# Patient Record
Sex: Female | Born: 1965 | Race: White | Hispanic: No | Smoking: Never smoker
Health system: Southern US, Community
[De-identification: ages and names within clinical notes are randomized; demographics above are authoritative.]

## PROBLEM LIST (undated history)

## (undated) DIAGNOSIS — Z8719 Personal history of other diseases of the digestive system: Secondary | ICD-10-CM

## (undated) DIAGNOSIS — E7800 Pure hypercholesterolemia, unspecified: Secondary | ICD-10-CM

## (undated) DIAGNOSIS — M1990 Unspecified osteoarthritis, unspecified site: Secondary | ICD-10-CM

## (undated) DIAGNOSIS — T4145XA Adverse effect of unspecified anesthetic, initial encounter: Secondary | ICD-10-CM

## (undated) DIAGNOSIS — Z8489 Family history of other specified conditions: Secondary | ICD-10-CM

## (undated) DIAGNOSIS — K8020 Calculus of gallbladder without cholecystitis without obstruction: Secondary | ICD-10-CM

## (undated) DIAGNOSIS — R51 Headache: Secondary | ICD-10-CM

## (undated) DIAGNOSIS — T8859XA Other complications of anesthesia, initial encounter: Secondary | ICD-10-CM

## (undated) HISTORY — DX: Pure hypercholesterolemia, unspecified: E78.00

## (undated) HISTORY — PX: SHOULDER SURGERY: SHX246

## (undated) HISTORY — PX: KNEE SURGERY: SHX244

## (undated) HISTORY — PX: SHOULDER ARTHROSCOPY WITH ROTATOR CUFF REPAIR AND OPEN BICEPS TENODESIS: SHX6677

## (undated) HISTORY — DX: Calculus of gallbladder without cholecystitis without obstruction: K80.20

## (undated) HISTORY — PX: ADENOIDECTOMY: SUR15

## (undated) HISTORY — PX: KNEE ARTHROSCOPY: SUR90

---

## 1999-06-18 ENCOUNTER — Other Ambulatory Visit: Admission: RE | Admit: 1999-06-18 | Discharge: 1999-06-18 | Payer: Self-pay | Admitting: Obstetrics and Gynecology

## 2000-07-21 ENCOUNTER — Other Ambulatory Visit: Admission: RE | Admit: 2000-07-21 | Discharge: 2000-07-21 | Payer: Self-pay | Admitting: Gynecology

## 2001-02-14 ENCOUNTER — Inpatient Hospital Stay (HOSPITAL_COMMUNITY): Admission: AD | Admit: 2001-02-14 | Discharge: 2001-02-18 | Payer: Self-pay | Admitting: *Deleted

## 2001-02-14 ENCOUNTER — Encounter (INDEPENDENT_AMBULATORY_CARE_PROVIDER_SITE_OTHER): Payer: Self-pay | Admitting: Specialist

## 2001-03-24 ENCOUNTER — Other Ambulatory Visit: Admission: RE | Admit: 2001-03-24 | Discharge: 2001-03-24 | Payer: Self-pay | Admitting: Gynecology

## 2002-03-31 ENCOUNTER — Other Ambulatory Visit: Admission: RE | Admit: 2002-03-31 | Discharge: 2002-03-31 | Payer: Self-pay | Admitting: Gynecology

## 2003-10-02 ENCOUNTER — Other Ambulatory Visit: Admission: RE | Admit: 2003-10-02 | Discharge: 2003-10-02 | Payer: Self-pay | Admitting: Gynecology

## 2003-10-09 ENCOUNTER — Ambulatory Visit (HOSPITAL_COMMUNITY): Admission: RE | Admit: 2003-10-09 | Discharge: 2003-10-09 | Payer: Self-pay | Admitting: Gynecology

## 2004-10-06 ENCOUNTER — Other Ambulatory Visit: Admission: RE | Admit: 2004-10-06 | Discharge: 2004-10-06 | Payer: Self-pay | Admitting: Gynecology

## 2005-03-12 ENCOUNTER — Other Ambulatory Visit: Admission: RE | Admit: 2005-03-12 | Discharge: 2005-03-12 | Payer: Self-pay | Admitting: Gynecology

## 2005-05-01 ENCOUNTER — Emergency Department (HOSPITAL_COMMUNITY): Admission: EM | Admit: 2005-05-01 | Discharge: 2005-05-01 | Payer: Self-pay | Admitting: Family Medicine

## 2005-07-01 ENCOUNTER — Inpatient Hospital Stay (HOSPITAL_COMMUNITY): Admission: AD | Admit: 2005-07-01 | Discharge: 2005-07-02 | Payer: Self-pay | Admitting: Gynecology

## 2005-08-17 ENCOUNTER — Inpatient Hospital Stay (HOSPITAL_COMMUNITY): Admission: AD | Admit: 2005-08-17 | Discharge: 2005-08-18 | Payer: Self-pay | Admitting: Gynecology

## 2005-09-09 ENCOUNTER — Encounter (INDEPENDENT_AMBULATORY_CARE_PROVIDER_SITE_OTHER): Payer: Self-pay | Admitting: Specialist

## 2005-09-09 ENCOUNTER — Inpatient Hospital Stay (HOSPITAL_COMMUNITY): Admission: RE | Admit: 2005-09-09 | Discharge: 2005-09-12 | Payer: Self-pay | Admitting: Gynecology

## 2005-10-21 ENCOUNTER — Other Ambulatory Visit: Admission: RE | Admit: 2005-10-21 | Discharge: 2005-10-21 | Payer: Self-pay | Admitting: Gynecology

## 2006-01-06 ENCOUNTER — Emergency Department (HOSPITAL_COMMUNITY): Admission: EM | Admit: 2006-01-06 | Discharge: 2006-01-06 | Payer: Self-pay | Admitting: Emergency Medicine

## 2006-10-27 ENCOUNTER — Other Ambulatory Visit: Admission: RE | Admit: 2006-10-27 | Discharge: 2006-10-27 | Payer: Self-pay | Admitting: Gynecology

## 2006-11-11 ENCOUNTER — Ambulatory Visit (HOSPITAL_COMMUNITY): Admission: RE | Admit: 2006-11-11 | Discharge: 2006-11-11 | Payer: Self-pay | Admitting: Gynecology

## 2007-11-02 ENCOUNTER — Encounter: Payer: Self-pay | Admitting: Gynecology

## 2007-11-02 ENCOUNTER — Other Ambulatory Visit: Admission: RE | Admit: 2007-11-02 | Discharge: 2007-11-02 | Payer: Self-pay | Admitting: Gynecology

## 2007-11-02 ENCOUNTER — Ambulatory Visit: Payer: Self-pay | Admitting: Gynecology

## 2008-01-12 ENCOUNTER — Ambulatory Visit (HOSPITAL_COMMUNITY): Admission: RE | Admit: 2008-01-12 | Discharge: 2008-01-12 | Payer: Self-pay | Admitting: Gynecology

## 2008-05-09 ENCOUNTER — Ambulatory Visit: Payer: Self-pay | Admitting: Women's Health

## 2008-06-04 ENCOUNTER — Ambulatory Visit: Payer: Self-pay | Admitting: Gynecology

## 2008-11-02 ENCOUNTER — Ambulatory Visit: Payer: Self-pay | Admitting: Gynecology

## 2008-11-02 ENCOUNTER — Other Ambulatory Visit: Admission: RE | Admit: 2008-11-02 | Discharge: 2008-11-02 | Payer: Self-pay | Admitting: Gynecology

## 2008-11-02 ENCOUNTER — Encounter: Payer: Self-pay | Admitting: Gynecology

## 2009-02-13 ENCOUNTER — Ambulatory Visit (HOSPITAL_COMMUNITY): Admission: RE | Admit: 2009-02-13 | Discharge: 2009-02-13 | Payer: Self-pay | Admitting: Gynecology

## 2009-09-12 ENCOUNTER — Ambulatory Visit (HOSPITAL_BASED_OUTPATIENT_CLINIC_OR_DEPARTMENT_OTHER): Admission: RE | Admit: 2009-09-12 | Discharge: 2009-09-12 | Payer: Self-pay | Admitting: Orthopedic Surgery

## 2009-11-04 ENCOUNTER — Other Ambulatory Visit: Admission: RE | Admit: 2009-11-04 | Discharge: 2009-11-04 | Payer: Self-pay | Admitting: Gynecology

## 2009-11-04 ENCOUNTER — Ambulatory Visit: Payer: Self-pay | Admitting: Gynecology

## 2009-11-26 ENCOUNTER — Ambulatory Visit: Payer: Self-pay | Admitting: Gynecology

## 2010-05-10 LAB — POCT HEMOGLOBIN-HEMACUE: Hemoglobin: 13.7 g/dL (ref 12.0–15.0)

## 2010-07-11 NOTE — H&P (Signed)
[facility]  Patient:    Jasmine Spencer, Jasmine Spencer Visit Number: [id number] MRN: [id number]          Service Type: OBS Location: 910B 9166 01 Attending Physician:  Wetzel Bjornstad Dictated by:   Katy Fitch, M.D. Admit Date:  02/14/2001                           History and Physical  CHIEF COMPLAINT:              1.  Post term pregnancy.                               2.  Decreased fetal movement.  HISTORY OF PRESENT ILLNESS:   The patient is a 45 year old, G1, at 41 weeks and 5 days, who presents to the hospital for decreased fetal movement.  The patient noticed decreased fetal movement over the past day.  The heart tracing is noted to be reactive, however, secondary to being post dates, the patient was admitted for induction.  The patient has had no complaints of fever, nausea, vomiting, chills, abdominal pain, or change in bowel or bladder habits.  The patient had her pregnancy conceived by Clomid, IUI, and Pergonal cycles, but has had no other medical problems during her pregnancy.  PAST MEDICAL HISTORY:         None.  PAST SURGICAL HISTORY:        Knee surgery.  PAST OBSTETRICAL HISTORY:     None.  MEDICATIONS:                  Prenatal vitamins.  ALLERGIES:                    None.  SOCIAL HISTORY:               Without any tobacco, alcohol, or drugs.  FAMILY HISTORY:               Without any mental retardation or epithelial cancers.  PRENATAL LABORATORY DATA:     O positive, rubella immune, GBS negative.  PHYSICAL EXAMINATION:  VITAL SIGNS:                  Blood pressure 122/70.  HEENT:                        Throat clear.  LUNGS:                        Clear to auscultation bilaterally.  HEART:                        Regular rate and rhythm.  ABDOMEN:                      Gravid and nontender.  Estimated fetal weight is 7 pounds and 7 ounces.  PELVIC EXAMINATION:           Cervix fingertip, thick, and high.   Tocolysis. Occasional fetal heart tracings in 140s and reactive.  PLAN:                         We will have patient on high-dose Pitocin.  We will try artificial rupture of membrane if the head descends into the  pelvis. at this point, we will continue on Pitocin and try rupture at a later point. Dictated by:   Katy Fitch, M.D. Attending Physician:  Wetzel Bjornstad DD:  02/15/01 TD:  02/15/01 Job: [id number]

## 2010-07-11 NOTE — Discharge Summary (Signed)
[facility]  Patient:    Jasmine Spencer, Jasmine Spencer Visit Number: [id number] MRN: [id number]          Service Type: OBS Location: 910A 9133 01 Attending Physician:  Jasmine Spencer Dictated by:   Jasmine Spencer, [facility] Admit Date:  02/14/2001 Discharge Date: 02/18/2001                             Discharge Summary  DISCHARGE DIAGNOSES: 1. Intrauterine pregnancy at 41-1/2 weeks. 2. Cephalopelvic disproportion. 3. Total late decelerations, resolved.  PROCEDURES:  Primary low cervical transverse cesarean section with delivery of a viable infant.  HISTORY OF PRESENT ILLNESS:  The patient is a 45 year old primigravida with last menstrual period of 05/03/00, estimated date of confinement 02/07/01. Prenatal risk factors included advanced maternal age.  LABORATORY DATA:  Blood type O+, antibody screen negative, RPR negative, HBSA negative.  Rubella immune.  Amniocentesis normal.  HOSPITAL COURSE AND TREATMENT:  The patient was admitted on 02/15/01, for induction of labor secondary to decreased fetal movement at 41-1/2 weeks. Induction was initiated with high dose Pitocin.  Cervix was fingertip, thick, and high.  The patient did fail to progress, and also developed some late subtle late decelerations which did resolve, but it was decided to proceed with cesarean section delivery.  Procedure was performed by Dr. Farrel Gobble.  The patient delivered an Apgar 8 and 10 female infant, weight 4175 g, clear amniotic fluid, double nuchal cord, normal uterus, tubes, and ovaries. Postoperatively, the patient remained afebrile.  Had no difficulty voiding. Could be discharged in satisfactory condition on her third postoperative day. CBC showed a hematocrit of 31.5, hemoglobin 11, white blood cell count 13.3, platelets 188.  DISPOSITION:  Follow up in six weeks.  Continue prenatal vitamins and iron, and Motrin and Tylox for pain. Dictated by:   Jasmine Spencer, Jasmine Spencer Attending  Physician:  Jasmine Spencer DD:  03/09/01 TD:  03/10/01 Job: [id number]

## 2010-07-11 NOTE — H&P (Signed)
Jasmine Spencer, Jasmine Spencer              ACCOUNT NO.:  [id number]   MEDICAL RECORD NO.:  [id number]          PATIENT TYPE:  INP   LOCATION:  NA                            FACILITY:  [facility]   PHYSICIAN:  Ivor Costa. Farrel Gobble, M.D. DATE OF BIRTH:  May 24, 1965   DATE OF ADMISSION:  10/10/2005  DATE OF DISCHARGE:                                HISTORY & PHYSICAL   PRINCIPAL DIAGNOSIS:  76 1/[redacted] weeks pregnant with a  prior cesarean section,  for elective repeat.   HISTORY OF PRESENT ILLNESS:  The patient is a 45 year old G2, P39 with an LMP  of December 16, 2004, estimated date of confinement of September 22, 2005, whose  estimated gestational age of 9 1/2 weeks with a prior history of cesarean  section, who now presents electively for repeat cesarean section. The  patient's pregnancy was complicated by advanced maternal age. This is a  pregnancy conceived with gonadotropin stimulation and donor sperm. The  patient was noted to have first trimester cystic hygroma that resolved. She  had an amniocentesis because of it, which showed 4 XX, there s normal heart  architecture seen in the late second trimester. The patient pregnancy has  otherwise been uncomplicated. The patient desires for repeat cesarean  section. She is O positive, antibody negative, RPR nonreactive, rubella  immune, hepatitis B surface antigen nonreactive, HIV nonreactive. GBS  positive, Glucola normal. Refer to the [facility]   PHYSICAL EXAMINATION:  GENERAL:  She is a well appearing gravida in no acute  distress.  HEART:  Regular rate.  LUNGS:  Clear to auscultation.  ABDOMEN:  Gravid, nontender. Fetal heart tones were auscultated.  VAGINAL EXAM:  Was deferred last visit, however, most recently was all  closed, posterior and high.  EXTREMITIES:  Negative.   ASSESSMENT:  Term pregnancy for an elective repeat cesarean section. Risks  and benefits were reviewed. All questions were addressed and the patient  will now present for  surgery.   Of note, she was given Tylox 1-2 q.6h. p.r.n. pain #20 for postoperative  pain management.      Ivor Costa. Farrel Gobble, M.D.  Electronically Signed     THL/MEDQ  D:  09/08/2005  T:  09/08/2005  Job:  [id number]

## 2010-07-11 NOTE — Discharge Summary (Signed)
NAMEMORRIS, Jasmine Spencer              ACCOUNT NO.:  [id number]   MEDICAL RECORD NO.:  [id number]          PATIENT TYPE:  INP   LOCATION:  9142                          [facility]  [facility]   PHYSICIAN:  Timothy P. Fontaine, M.D.DATE OF BIRTH:  1966/01/29   DATE OF ADMISSION:  09/09/2005  DATE OF DISCHARGE:  09/12/2005                                 DISCHARGE SUMMARY   DISCHARGE DIAGNOSES:  1. Pregnancy at term.  2. Prior cesarean section, for elective repeat cesarean section.   PROCEDURE:  Repeat low transverse cervical cesarean section, September 09, 2005,  Dr. Douglass Rivers.   HOSPITAL COURSE:  The patient was admitted for a scheduled repeat low  transverse cervical cesarean section September 09, 2005, and delivered a normal  female infant, Apgars 9 and 9, weight 7 pounds 10 ounces.  The patient was  in a floating vertex which was ultimately converted to a footling breech for  delivery.  The patient's post operative course was uncomplicated.  She was  discharged on postoperative day #3 ambulating well, tolerating a regular  diet, with a postoperative hemoglobin of 10.1, rubella titer positive, blood  type O positive.  The patient received precautions, instructions and  followup, will be seen in the office at 6 weeks for her postpartum followup.  She was previously given preoperatively a prescription for Percocet for pain  by Dr. Farrel Gobble.      Timothy P. Fontaine, M.D.  Electronically Signed     TPF/MEDQ  D:  10/12/2005  T:  10/13/2005  Job:  [id number]

## 2010-07-11 NOTE — Op Note (Signed)
[facility]  Patient:    Jasmine Spencer, Jasmine Spencer Visit Number: [id number] MRN: [id number]          Service Type: OBS Location: 910A 9133 01 Attending Physician:  Wetzel Bjornstad Dictated by:   Douglass Rivers, M.D. Proc. Date: 02/15/01 Admit Date:  02/14/2001                             Operative Report  PREOPERATIVE DIAGNOSES:       1. Intrauterine pregnancy at 41-1/2 weeks.                               2. Cephalopelvic disproportion.                               3. Subtle late decelerations--resolved.  POSTOPERATIVE DIAGNOSES:      1. Intrauterine pregnancy at 41-1/2 weeks.                               2. Cephalopelvic disproportion.                               3. Subtle late decelerations--resolved.  PROCEDURE:                    Primary cesarean section low flap transverse.  SURGEON:                      Douglass Rivers, M.D.  ASSISTANT:                    Scrub tech, Haydee Salter.  ANESTHESIA:                   Epidural.  INTRAVENOUS FLUID:            1400 cc of lactated Ringers.  ESTIMATED BLOOD LOSS:         700 cc.  URINE OUTPUT:                 150 cc clear urine.  FINDINGS:                     Viable female infant, vertex presentation, and double nuchal cord was noted. Clear amniotic fluid. Apgars 8 and 9. Birth weight 4175 g. Normal uterus, tubes, and ovaries.  COMPLICATIONS:                None.  PATHOLOGY:                    Placenta.  DESCRIPTION OF PROCEDURE:     The patient was taken to the operating room and placed in the supine position in left lateral displacement and prepped and draped in the usual sterile fashion. After adequate anesthesia was insured, a Pfannenstiel skin incision was made with the scalpel and carried through the underlying layer of fascia with electrocautery. The fascia was scored in the midline and the incision was extended laterally with Mayo scissors. The inferior aspect of the fascial incision was  grasped with Kochers and aligned rectus muscles were dissected off by blunt and sharp dissection. In similar fashion, the superior aspect of the incision was grasped with  Kochers in the midline where rectus muscles were dissected off. The rectus muscles were separated in the midline and the peritoneum was identified and bluntly the peritoneal incision was then extended superiorly and inferiorly. With good visualization of the underlying bowel and bladder, the orientation of the uterus was confirmed. The bladder blade was inserted. The vesicouterine peritoneum was identified, tented up, and entered sharply with the Metzenbaum scissors. The incision was extended laterally. The bladder flap was created digitally. The bladder blade was then reinserted in the lower uterine segment incised in a transverse fashion with the scalpel. The infant was delivered from the vertex presentation. The vertex was noted to be unengaged. Double nuchal cord was reduced prior to delivery, usual maneuvers, and the cord was cut and clamped and the infant was handed off to the awaiting pediatricians. Cord bloods were obtained. The uterus was massaged. The placenta was allowed to separate naturally. The membranes appeared to not be intact. Examination of the uterus did show remnant membranes and these were gently teased off with Kelly clamp. After multiple attempts, we assured ourselves that the uterus was actually cleared of all membranes. The uterine incision was then closed with a running locked layer of 0 chromic and noted to be hemostatic. The gutters were cleared of all clots and debris. The adnexal were inspected and noted to be normal. The bladder flap, peritoneum, and muscles were noted to be hemostatic. The fascia was then closed with 0 Vicryl starting at the apex in a running suture. The subcutaneous was irrigated. The skin was closed with staples. The patient tolerated the procedure well. Sponge, lap, and  needle counts were correct x two. She was given Ancef intraoperatively and transferred to the PACU in stable condition. Dictated by:   Douglass Rivers, M.D. Attending Physician:  Wetzel Bjornstad DD:  02/15/01 TD:  02/16/01 Job: [id number]

## 2010-07-11 NOTE — Op Note (Signed)
NAMESULTANA, TIERNEY              ACCOUNT NO.:  [id number]   MEDICAL RECORD NO.:  [id number]          PATIENT TYPE:  INP   LOCATION:  9142                          [facility]AC[facility]:  [facility]   PHYSICIAN:  Ivor Costa. Farrel Gobble, M.D. DATE OF BIRTH:  1966/01/06   DATE OF PROCEDURE:  09/09/2005  DATE OF DISCHARGE:                                 OPERATIVE REPORT   PREOPERATIVE DIAGNOSIS:  Previous cesarean section, term intrauterine  pregnancy.   POSTOPERATIVE DIAGNOSIS:  Previous cesarean section, term intrauterine  pregnancy.   PROCEDURE:  Elective repeat cesarean section.   SURGEON:  Ivor Costa. Farrel Gobble, M.D.   ASSISTANTMarcial Pacas P. Fontaine, M.D.   ANESTHESIA:  Spinal.   IV FLUIDS:  3 liters lactated Ringer's.   ESTIMATED BLOOD LOSS:  400 mL.   URINE OUTPUT:  300 mL of clear urine.   FINDINGS:  A viable female with a floating vertex delivered footling breech,  Apgars 09/09, birth weight 07/10, normal uterus, tubes and ovaries.   COMPLICATIONS:  None.   PATHOLOGY:  Placenta for cord blood banking and then to pathology.   PROCEDURE:  The patient taken to the operating room.  Spinal anesthesia was  induced and placed in supine position left lateral displacement, prepped,  draped usual sterile fashion.  After adequate anesthesia was assured, a  Pfannenstiel incision made with scalpel and carried to underlying layer of  fascia with Bovie.  Fascia was scored in the midline.  Incision was then  laterally with the Bovie.  The inferior fascial incision was grasped with  Kochers, underlying rectus muscles dissected off by blunt sharp dissection  similar fashion.  Superior aspect incision was grasped with Kochers and  underlying rectus muscles were dissected off, the rectus muscles were  separated midline.  Peritoneal cavity was entered bluntly.  The peritoneal  incision was then extended superiorly and inferiorly with good visualization  underlying bowel and bladder.  The bladder blade was  inserted.  Vesicouterine peritoneum identified, tented up and entered sharply with  Metzenbaums. Incision was extended laterally.  Bladder flap was created  digitally.  Bladder blade was then reinserted, lower uterine segment was  incised in transverse fashion with scalpel.  Clear amniotic fluid was noted  upon entering the cavity.  The vertex was floating and the infant changed to  transverse lie.  We were unable to bring the vertex back into the incision  and therefore the infant was converted to breech and was delivered double  footling breech with the usual maneuvers.  Cord was cut and clamped and  infant handed off to waiting pediatricians.  Cord bloods were obtained.  The  uterus was massaged, placenta allowed to separate naturally.  The uterus was  then cleared of all clots and debris.  The uterine incision was then  repaired with a running locked layer of 0 chromic, a portion of which was  imbricated for hemostatic purposes.  The adnexa were inspected, the pelvis  was irrigated with copious amounts of warm saline.  The peritoneum muscle  and fascia were all noted to be hemostatic.  The fascia then closed  with 0  Vicryl in running fashion.  The subcu was irrigated, reapproximated with 3-0  plain and the skin was closed with 4-0 Vicryl on a Mellody Dance.  The patient  tolerated procedure well.  Sponge, lap and needle counts correct x2.  She  was transferred to PACU in stable condition.      Ivor Costa. Farrel Gobble, M.D.  Electronically Signed     THL/MEDQ  D:  09/09/2005  T:  09/09/2005  Job:  [id number]

## 2010-07-11 NOTE — Consult Note (Signed)
Jasmine Spencer, Jasmine Spencer              ACCOUNT NO.:  [id number]   MEDICAL RECORD NO.:  [id number]          PATIENT TYPE:  INP   LOCATION:  9156                          [facility]ACILITY:  [facility]   PHYSICIAN:  Juan H. Lily Peer, M.D.DATE OF BIRTH:  [birth year]   DATE OF CONSULTATION:  08/18/2005  DATE OF DISCHARGE:                                   CONSULTATION   HISTORY OF PRESENT ILLNESS:  The patient is a 45 year old gravida 2, para 1  whose estimated date of confinement is September 22, 2005.  She presented to  [facility] on the evening of June 25 complaining of low back  discomfort and tightening in her abdomen.  She was placed on the monitor  room and was found to have contractions that were occurring every 5-6  minutes apart.  She was given IV fluids consisting of bolus of LR 500 cc  followed by 125 cc per hour and received Terbutaline 0.25 mg subcu x2 30  minutes apart and help defervesced the contraction pattern.  She was kept  overnight in the hospital for observation.  Her fetal fibronectin was  negative.  Urinalysis was negative. Vital signs were as follows:  Temperature 98.1, pulse 85, respirations 20, blood pressure 122/72.  She had  a reassuring fetal heart rate tracing.  Her pelvic exam this morning:  Her  cervix was long, closed and posterior.  The patient was totally asymptomatic  and had been kept on p.o. Terbutaline 2.5 mg p.o. q.4 h.  She will be  discharged this morning and is to continue Terbutaline 2.5 mg p.o. q.4 h  until completion of 37 weeks, and she has a follow up appointment in the  office next week.  Instructions were provided.  Will follow accordingly.      Juan H. Lily Peer, M.D.  Electronically Signed     JHF/MEDQ  D:  08/18/2005  T:  08/18/2005  Job:  [id number]

## 2010-08-02 ENCOUNTER — Inpatient Hospital Stay (INDEPENDENT_AMBULATORY_CARE_PROVIDER_SITE_OTHER)
Admission: RE | Admit: 2010-08-02 | Discharge: 2010-08-02 | Disposition: A | Discharge status: Home / Self Care | Payer: BC Managed Care – PPO | Source: Ambulatory Visit | Attending: Emergency Medicine | Admitting: Emergency Medicine

## 2010-08-02 DIAGNOSIS — J029 Acute pharyngitis, unspecified: Secondary | ICD-10-CM

## 2010-08-02 DIAGNOSIS — R5381 Other malaise: Secondary | ICD-10-CM

## 2010-08-28 ENCOUNTER — Ambulatory Visit (INDEPENDENT_AMBULATORY_CARE_PROVIDER_SITE_OTHER): Payer: BC Managed Care – PPO | Admitting: Gynecology

## 2010-08-28 DIAGNOSIS — B373 Candidiasis of vulva and vagina: Secondary | ICD-10-CM

## 2010-08-28 DIAGNOSIS — R823 Hemoglobinuria: Secondary | ICD-10-CM

## 2010-08-28 DIAGNOSIS — R350 Frequency of micturition: Secondary | ICD-10-CM

## 2010-08-28 DIAGNOSIS — N898 Other specified noninflammatory disorders of vagina: Secondary | ICD-10-CM

## 2010-11-12 ENCOUNTER — Encounter: Payer: Self-pay | Admitting: Women's Health

## 2010-11-12 ENCOUNTER — Ambulatory Visit (INDEPENDENT_AMBULATORY_CARE_PROVIDER_SITE_OTHER): Payer: BC Managed Care – PPO | Admitting: Women's Health

## 2010-11-12 ENCOUNTER — Other Ambulatory Visit (HOSPITAL_COMMUNITY)
Admission: RE | Admit: 2010-11-12 | Discharge: 2010-11-12 | Disposition: A | Discharge status: Home / Self Care | Payer: BC Managed Care – PPO | Source: Ambulatory Visit | Attending: Gynecology | Admitting: Gynecology

## 2010-11-12 VITALS — BP 120/76 | Ht 63.0 in | Wt 198.0 lb

## 2010-11-12 DIAGNOSIS — Z01419 Encounter for gynecological examination (general) (routine) without abnormal findings: Secondary | ICD-10-CM | POA: Insufficient documentation

## 2010-11-12 DIAGNOSIS — Z833 Family history of diabetes mellitus: Secondary | ICD-10-CM

## 2010-11-12 NOTE — Progress Notes (Signed)
Jasmine Spencer [birth year] [id number]    History:    The patient presents for annual exam.  English teacher, softball coach. States her partner has been out of work, Actor of home, currently living with her brother, states he has a large home with plenty of room. Son [name] is 29, Florentina Addison is 5, both conceived with donor insemination.    Past medical history, past surgical history, family history and social history were all reviewed and documented in the EPIC chart.   ROS:  A  ROS was performed and pertinent positives and negatives are included in the history.  Exam:  Filed Vitals:   11/12/10 1201  BP: 120/76    General appearance:  Normal Head/Neck:  Normal, without cervical or supraclavicular adenopathy. Thyroid:  Symmetrical, normal in size, without palpable masses or nodularity. Respiratory  Effort:  Normal  Auscultation:  Clear without wheezing or rhonchi Cardiovascular  Auscultation:  Regular rate, without rubs, murmurs or gallops  Edema/varicosities:  Not grossly evident Abdominal  Soft,nontender, without masses, guarding or rebound.  Liver/spleen:  No organomegaly noted  Hernia:  None appreciated  Skin  Inspection:  Grossly normal  Palpation:  Grossly normal Neurologic/psychiatric  Orientation:  Normal with appropriate conversation.  Mood/affect:  Normal  Genitourinary    Breasts: Examined lying and sitting.     Right: Without masses, retractions, discharge or axillary adenopathy.     Left: Without masses, retractions, discharge or axillary adenopathy.   Inguinal/mons:  Normal without inguinal adenopathy  External genitalia:  Normal  BUS/Urethra/Skene's glands:  Normal  Bladder:  Normal  Vagina:  Normal  Cervix:  Normal  Uterus:   normal in size, shape and contour.  Midline and mobile  Adnexa/parametria:     Rt: Without masses or tenderness.   Lt: Without masses or tenderness.  Anus and perineum: Normal  Digital rectal exam: Normal sphincter tone without  palpated masses or tenderness  Assessment/Plan:  45 y.o. SWF G2P2 for annual exam. Lesbian with long-term partner, states  good relationship. Monthly cycle for 6-7 days. Significant history, mother had melanoma and did review the importance of an annual skin check, she will get scheduled. She voiced concerns about switching words, wondered if it was a sign of Alzheimer's, but she does work full time during the week, coaches is during the weekend and rarely has a day off. Did review this is caused more from fatigue and not a common sign of Alzheimer's. Denies any other memory issues and no family history.  Normal GYN exam  Plan: SBEs, and yearly mammogram which is overdue, will get scheduled. Encourage cutting calories for weight loss for health.continue active lifestyle,  Increase rest. CBC, glucose, UA and Pap.     Harrington Challenger WHNP, 1:45 PM 11/12/2010

## 2010-11-12 NOTE — Patient Instructions (Signed)
Schedule mammogram No late night snacking!!

## 2010-11-13 ENCOUNTER — Encounter: Payer: BC Managed Care – PPO | Admitting: Gynecology

## 2011-06-29 ENCOUNTER — Encounter: Payer: Self-pay | Admitting: Women's Health

## 2011-06-29 ENCOUNTER — Ambulatory Visit (INDEPENDENT_AMBULATORY_CARE_PROVIDER_SITE_OTHER): Payer: BC Managed Care – PPO | Admitting: Women's Health

## 2011-06-29 DIAGNOSIS — B49 Unspecified mycosis: Secondary | ICD-10-CM

## 2011-06-29 DIAGNOSIS — M545 Low back pain: Secondary | ICD-10-CM

## 2011-06-29 DIAGNOSIS — G8929 Other chronic pain: Secondary | ICD-10-CM | POA: Insufficient documentation

## 2011-06-29 DIAGNOSIS — B379 Candidiasis, unspecified: Secondary | ICD-10-CM

## 2011-06-29 LAB — URINALYSIS W MICROSCOPIC + REFLEX CULTURE
Bilirubin Urine: NEGATIVE
Glucose, UA: NEGATIVE mg/dL
Leukocytes, UA: NEGATIVE
Protein, ur: NEGATIVE mg/dL
Specific Gravity, Urine: <1.005 — ABNORMAL LOW (ref 1.005–1.030)
pH: 6.5 (ref 5.0–8.0)

## 2011-06-29 MED ORDER — SULFAMETHOXAZOLE-TRIMETHOPRIM 800-160 MG PO TABS: 1.0000 | ORAL_TABLET | Freq: Two times a day (BID) | ORAL | Status: AC

## 2011-06-29 MED ORDER — FLUCONAZOLE 150 MG PO TABS: 150.0000 mg | ORAL_TABLET | Freq: Once | ORAL | Status: AC

## 2011-06-29 NOTE — Patient Instructions (Signed)

## 2011-06-29 NOTE — Progress Notes (Signed)
Patient ID: Jasmine Spencer, female   DOB: Dec 09, 1965, 46 y.o.   MRN: [id number] Presents with complaint of a backache, and generally not feeling well with slight nausea for 4-5 days. States these are the symptoms she gets when she has a UTI. Denies any pain or burning with urination. Denies a fever, cough or cold symptoms. States has a small amount of discharge, denies itching or odor.  Exam: UA trace blood, 0-2 RBCs, no  WBCs. External genitalia within normal limits, speculum exam scant white discharge no erythema noted, wet prep negative.  Probable UTI  Plan: Septra DS one by mouth twice a day for 3 days #6, prescription proper use given and reviewed. Urine culture pending. Diflucan 150 by mouth x1 dose if vaginal itching after antibiotic. Aware of UTI prevention.

## 2011-08-28 ENCOUNTER — Other Ambulatory Visit: Payer: Self-pay | Admitting: Gynecology

## 2011-08-28 DIAGNOSIS — Z1231 Encounter for screening mammogram for malignant neoplasm of breast: Secondary | ICD-10-CM

## 2011-09-15 ENCOUNTER — Encounter (HOSPITAL_COMMUNITY): Payer: Self-pay | Admitting: Pharmacy Technician

## 2011-09-18 ENCOUNTER — Ambulatory Visit (HOSPITAL_COMMUNITY)
Admission: RE | Admit: 2011-09-18 | Discharge: 2011-09-18 | Disposition: A | Discharge status: Home / Self Care | Payer: BC Managed Care – PPO | Source: Ambulatory Visit | Attending: Gynecology | Admitting: Gynecology

## 2011-09-18 DIAGNOSIS — Z1231 Encounter for screening mammogram for malignant neoplasm of breast: Secondary | ICD-10-CM | POA: Insufficient documentation

## 2011-09-25 ENCOUNTER — Encounter (HOSPITAL_COMMUNITY): Payer: Self-pay

## 2011-09-25 ENCOUNTER — Encounter (HOSPITAL_COMMUNITY)
Admission: RE | Admit: 2011-09-25 | Discharge: 2011-09-25 | Disposition: A | Discharge status: Home / Self Care | Payer: BC Managed Care – PPO | Source: Ambulatory Visit | Attending: Surgery | Admitting: Surgery

## 2011-09-25 ENCOUNTER — Encounter (HOSPITAL_COMMUNITY)
Admission: RE | Admit: 2011-09-25 | Discharge: 2011-09-25 | Disposition: A | Discharge status: Home / Self Care | Payer: BC Managed Care – PPO | Source: Ambulatory Visit | Attending: Orthopedic Surgery | Admitting: Orthopedic Surgery

## 2011-09-25 HISTORY — DX: Unspecified osteoarthritis, unspecified site: M19.90

## 2011-09-25 HISTORY — DX: Adverse effect of unspecified anesthetic, initial encounter: T41.45XA

## 2011-09-25 HISTORY — DX: Personal history of other diseases of the digestive system: Z87.19

## 2011-09-25 HISTORY — DX: Other complications of anesthesia, initial encounter: T88.59XA

## 2011-09-25 HISTORY — DX: Family history of other specified conditions: Z84.89

## 2011-09-25 HISTORY — DX: Headache: R51

## 2011-09-25 LAB — COMPREHENSIVE METABOLIC PANEL
ALT: 17 U/L (ref 0–35)
AST: 16 U/L (ref 0–37)
Alkaline Phosphatase: 67 U/L (ref 39–117)
CO2: 28 mEq/L (ref 19–32)
Calcium: 9.8 mg/dL (ref 8.4–10.5)
GFR calc Af Amer: >90 mL/min (ref >90)
Glucose, Bld: 95 mg/dL (ref 70–99)
Potassium: 3.8 mEq/L (ref 3.5–5.1)
Sodium: 140 mEq/L (ref 135–145)
Total Protein: 7.6 g/dL (ref 6.0–8.3)

## 2011-09-25 LAB — TYPE AND SCREEN
ABO/RH(D): O POS
Antibody Screen: NEGATIVE

## 2011-09-25 LAB — CBC
Hemoglobin: 13.6 g/dL (ref 12.0–15.0)
MCHC: 35.1 g/dL (ref 30.0–36.0)
Platelets: 275 10*3/uL (ref 150–400)
RBC: 4.58 MIL/uL (ref 3.87–5.11)

## 2011-09-25 LAB — URINALYSIS, ROUTINE W REFLEX MICROSCOPIC
Leukocytes, UA: NEGATIVE
Nitrite: NEGATIVE
Protein, ur: NEGATIVE mg/dL
Specific Gravity, Urine: 1.013 (ref 1.005–1.030)
Urobilinogen, UA: 0.2 mg/dL (ref 0.0–1.0)

## 2011-09-25 LAB — HCG, SERUM, QUALITATIVE: Preg, Serum: NEGATIVE

## 2011-09-25 LAB — URINE MICROSCOPIC-ADD ON

## 2011-09-25 LAB — APTT: aPTT: 34 seconds (ref 24–37)

## 2011-09-25 LAB — SURGICAL PCR SCREEN: Staphylococcus aureus: NEGATIVE

## 2011-09-25 NOTE — Progress Notes (Signed)
Pt. States she has never had an ekg.

## 2011-09-25 NOTE — Pre-Procedure Instructions (Signed)
20 LENOLA LOCKNER  09/25/2011   Your procedure is scheduled on:  09/30/2011  Report to [facility] at 9:15 AM.  Call this number if you have problems the morning of surgery: [phone]   Remember:   Do not eat food or clear Liquids :After Midnight.- Tuesday    Take these medicines the morning of surgery with A SIP OF WATER: none   Do not wear jewelry, make-up or nail polish.  Do not wear lotions, powders, or perfumes. You may wear deodorant.  Do not shave 48 hours prior to surgery. Men may shave face and neck.  Do not bring valuables to the hospital.  Contacts, dentures or bridgework may not be worn into surgery.  Leave suitcase in the car. After surgery it may be brought to your room.  For patients admitted to the hospital, checkout time is 11:00 AM the day of discharge.   Patients discharged the day of surgery will not be allowed to drive home.  Name and phone number of your driver: /w significant other   Special Instructions: CHG Shower Use Special Wash: 1/2 bottle night before surgery and 1/2 bottle morning of surgery.   Please read over the following fact sheets that you were given: Pain Booklet, Coughing and Deep Breathing, Blood Transfusion Information, Total Joint Packet, MRSA Information and Surgical Site Infection Prevention

## 2011-09-29 MED ORDER — VANCOMYCIN HCL IN DEXTROSE 1-5 GM/200ML-% IV SOLN
1000.0000 mg | INTRAVENOUS | Status: AC
  Administered 2011-09-30: 1000 mg via INTRAVENOUS
  Filled 2011-09-29: qty 200

## 2011-09-29 NOTE — H&P (Signed)
  [facility] [address]   [address] [place], [place] [address] [phone] A Division of [facility]  Loreta Ave, M.D.     Robert A. Thurston Hole, M.D.     Lunette Stands, M.D. Eulas Post, M.D.    Buford Dresser, M.D. Estell Harpin, M.D. Genene Churn. Barry Dienes, PA-C            Kirstin A. Shepperson, PA-C [place], OPA-C   RE: Jasmine Spencer, Jasmine Spencer   [id number]      DOB: 08-Aug-1965 PROGRESS NOTE: 09-22-11 Chief complaint: right knee pain.  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 States knee symptoms unchanged from previous visit. She' wanting to proceed with total knee replacement as scheduled.  Current medications: Fenofibrate Aleve Excedrin migraine. Allergies: latex penicillin lactose intolerance.  Past medical/surgical history: right knee arthroscopy shoulder surgery C-section. Family history: positive heart disease hypertension diabetes seizures arthritis cancer. Social history: she's married admits occasional alcohol use denies smoking. Review of systems: denies fevers chills lightheadedness dizziness cardiac pulmonary GI GU issues.  EXAMINATION: Alert and oriented x3 in no acute distress. Height 5'3"weight 204 pounds. Blood pressure 112/73 pulse 67. Temp 97.8. No increase in respiratory effort. Head is normal cephalic atraumatic. PERRLA and EOMI. Neck unremarkable. Lungs CTA bilaterally. No wheezes noted. Heart regular rate and rhythm S1 S2. No murmurs. Abdomen round non-distended. NABS x4. Soft non-tender. Right knee decreased range of motion positive crepitus joint line tender positive effusion. Ligaments stable. Calf non-tender neurovascularly intact. Skin warm and dry.  IMPRESSION: End stage degenerative joint disease right knee and chronic pain. Failed conservative treatment.  PLAN: Will proceed with right total  knee replacement as scheduled. Discussed risks benefits and possible complications. All questions answered.  Genene Churn. Barry Dienes, PA-C Electronically verified by Loreta Ave, M.D. DFM(JMO):kh D 09-22-11 T 09-24-11

## 2011-09-30 ENCOUNTER — Inpatient Hospital Stay (HOSPITAL_COMMUNITY): Payer: BC Managed Care – PPO

## 2011-09-30 ENCOUNTER — Encounter (HOSPITAL_COMMUNITY): Payer: Self-pay | Admitting: Certified Registered"

## 2011-09-30 ENCOUNTER — Inpatient Hospital Stay (HOSPITAL_COMMUNITY)
Admission: RE | Admit: 2011-09-30 | Discharge: 2011-10-03 | DRG: 209 | Disposition: A | Discharge status: Home Health | Payer: BC Managed Care – PPO | Source: Ambulatory Visit | Attending: Orthopedic Surgery | Admitting: Orthopedic Surgery

## 2011-09-30 ENCOUNTER — Inpatient Hospital Stay (HOSPITAL_COMMUNITY): Payer: BC Managed Care – PPO | Admitting: Certified Registered"

## 2011-09-30 ENCOUNTER — Encounter (HOSPITAL_COMMUNITY): Payer: Self-pay | Admitting: *Deleted

## 2011-09-30 ENCOUNTER — Encounter (HOSPITAL_COMMUNITY)
Admission: RE | Disposition: A | Discharge status: Home Health | Payer: Self-pay | Source: Ambulatory Visit | Attending: Orthopedic Surgery

## 2011-09-30 DIAGNOSIS — Z01810 Encounter for preprocedural cardiovascular examination: Secondary | ICD-10-CM

## 2011-09-30 DIAGNOSIS — Z01818 Encounter for other preprocedural examination: Secondary | ICD-10-CM

## 2011-09-30 DIAGNOSIS — Z471 Aftercare following joint replacement surgery: Secondary | ICD-10-CM

## 2011-09-30 DIAGNOSIS — Z01812 Encounter for preprocedural laboratory examination: Secondary | ICD-10-CM

## 2011-09-30 DIAGNOSIS — M1710 Unilateral primary osteoarthritis, unspecified knee: Principal | ICD-10-CM | POA: Diagnosis present

## 2011-09-30 DIAGNOSIS — E871 Hypo-osmolality and hyponatremia: Secondary | ICD-10-CM | POA: Diagnosis not present

## 2011-09-30 HISTORY — PX: TOTAL KNEE ARTHROPLASTY: SHX125

## 2011-09-30 SURGERY — ARTHROPLASTY, KNEE, TOTAL
Anesthesia: General | Site: Knee | Laterality: Right | Wound class: Clean

## 2011-09-30 MED ORDER — MORPHINE SULFATE 2 MG/ML IJ SOLN
INTRAMUSCULAR | Status: AC
  Filled 2011-09-30: qty 2

## 2011-09-30 MED ORDER — ACETAMINOPHEN 650 MG RE SUPP: 650.0000 mg | Freq: Four times a day (QID) | RECTAL | Status: DC | PRN

## 2011-09-30 MED ORDER — VANCOMYCIN HCL IN DEXTROSE 1-5 GM/200ML-% IV SOLN
1000.0000 mg | Freq: Two times a day (BID) | INTRAVENOUS | Status: AC
  Administered 2011-09-30: 1000 mg via INTRAVENOUS
  Filled 2011-09-30: qty 200

## 2011-09-30 MED ORDER — HYDROMORPHONE HCL PF 1 MG/ML IJ SOLN
0.2500 mg | INTRAMUSCULAR | Status: DC | PRN
  Administered 2011-09-30: 0.5 mg via INTRAVENOUS

## 2011-09-30 MED ORDER — DEXAMETHASONE SODIUM PHOSPHATE 4 MG/ML IJ SOLN
INTRAMUSCULAR | Status: DC | PRN
  Administered 2011-09-30: 8 mg via INTRAVENOUS

## 2011-09-30 MED ORDER — METOCLOPRAMIDE HCL 5 MG/ML IJ SOLN: 5.0000 mg | Freq: Three times a day (TID) | INTRAMUSCULAR | Status: DC | PRN

## 2011-09-30 MED ORDER — FENOFIBRATE 54 MG PO TABS
54.0000 mg | ORAL_TABLET | Freq: Every day | ORAL | Status: DC
  Administered 2011-10-01 – 2011-10-03 (×3): 54 mg via ORAL
  Filled 2011-09-30 (×4): qty 1

## 2011-09-30 MED ORDER — BUPIVACAINE-EPINEPHRINE PF 0.5-1:200000 % IJ SOLN
INTRAMUSCULAR | Status: DC | PRN
  Administered 2011-09-30: 30 mL

## 2011-09-30 MED ORDER — LIDOCAINE HCL (CARDIAC) 20 MG/ML IV SOLN
INTRAVENOUS | Status: DC | PRN
  Administered 2011-09-30: 50 mg via INTRAVENOUS

## 2011-09-30 MED ORDER — COUMADIN BOOK
Freq: Once | Status: AC
  Administered 2011-10-01: 06:00:00
  Filled 2011-09-30: qty 1

## 2011-09-30 MED ORDER — WARFARIN VIDEO: Freq: Once | Status: DC

## 2011-09-30 MED ORDER — POTASSIUM CHLORIDE IN NACL 20-0.9 MEQ/L-% IV SOLN
INTRAVENOUS | Status: DC
  Administered 2011-09-30 – 2011-10-01 (×2): via INTRAVENOUS
  Filled 2011-09-30 (×9): qty 1000

## 2011-09-30 MED ORDER — METHOCARBAMOL 100 MG/ML IJ SOLN
500.0000 mg | INTRAVENOUS | Status: AC
  Administered 2011-09-30: 500 mg via INTRAVENOUS
  Filled 2011-09-30: qty 5

## 2011-09-30 MED ORDER — PROPOFOL 10 MG/ML IV EMUL
INTRAVENOUS | Status: DC | PRN
  Administered 2011-09-30: 200 mg via INTRAVENOUS

## 2011-09-30 MED ORDER — BUPIVACAINE HCL (PF) 0.25 % IJ SOLN
INTRAMUSCULAR | Status: AC
  Filled 2011-09-30: qty 30

## 2011-09-30 MED ORDER — SENNOSIDES-DOCUSATE SODIUM 8.6-50 MG PO TABS: 1.0000 | ORAL_TABLET | Freq: Every evening | ORAL | Status: DC | PRN

## 2011-09-30 MED ORDER — ONDANSETRON HCL 4 MG PO TABS
4.0000 mg | ORAL_TABLET | Freq: Four times a day (QID) | ORAL | Status: DC | PRN
  Administered 2011-10-02: 4 mg via ORAL
  Filled 2011-09-30: qty 1

## 2011-09-30 MED ORDER — METHOCARBAMOL 100 MG/ML IJ SOLN
500.0000 mg | Freq: Four times a day (QID) | INTRAVENOUS | Status: DC | PRN
  Filled 2011-09-30: qty 5

## 2011-09-30 MED ORDER — DOCUSATE SODIUM 100 MG PO CAPS
100.0000 mg | ORAL_CAPSULE | Freq: Two times a day (BID) | ORAL | Status: DC
  Administered 2011-09-30 – 2011-10-03 (×6): 100 mg via ORAL
  Filled 2011-09-30 (×7): qty 1

## 2011-09-30 MED ORDER — WARFARIN - PHARMACIST DOSING INPATIENT: Freq: Every day | Status: DC

## 2011-09-30 MED ORDER — MORPHINE SULFATE (PF) 1 MG/ML IV SOLN
INTRAVENOUS | Status: DC
  Administered 2011-09-30: 1.5 mg via INTRAVENOUS
  Administered 2011-09-30: 13:00:00 via INTRAVENOUS
  Administered 2011-10-01: 6 mg via INTRAVENOUS
  Administered 2011-10-01: 1.5 mg via INTRAVENOUS

## 2011-09-30 MED ORDER — FLEET ENEMA 7-19 GM/118ML RE ENEM: 1.0000 | ENEMA | Freq: Once | RECTAL | Status: AC | PRN

## 2011-09-30 MED ORDER — ONDANSETRON HCL 4 MG/2ML IJ SOLN
INTRAMUSCULAR | Status: DC | PRN
  Administered 2011-09-30: 4 mg via INTRAVENOUS

## 2011-09-30 MED ORDER — METOCLOPRAMIDE HCL 10 MG PO TABS
5.0000 mg | ORAL_TABLET | Freq: Three times a day (TID) | ORAL | Status: DC | PRN
  Administered 2011-10-02: 5 mg via ORAL
  Administered 2011-10-02 – 2011-10-03 (×2): 10 mg via ORAL
  Filled 2011-09-30 (×3): qty 1

## 2011-09-30 MED ORDER — ENOXAPARIN SODIUM 30 MG/0.3ML ~~LOC~~ SOLN
30.0000 mg | Freq: Two times a day (BID) | SUBCUTANEOUS | Status: DC
  Administered 2011-10-01 – 2011-10-03 (×5): 30 mg via SUBCUTANEOUS
  Filled 2011-09-30 (×7): qty 0.3

## 2011-09-30 MED ORDER — PHENOL 1.4 % MT LIQD: 1.0000 | OROMUCOSAL | Status: DC | PRN

## 2011-09-30 MED ORDER — NALOXONE HCL 0.4 MG/ML IJ SOLN: 0.4000 mg | INTRAMUSCULAR | Status: DC | PRN

## 2011-09-30 MED ORDER — ACETAMINOPHEN 325 MG PO TABS: 650.0000 mg | ORAL_TABLET | Freq: Four times a day (QID) | ORAL | Status: DC | PRN

## 2011-09-30 MED ORDER — WARFARIN SODIUM 7.5 MG PO TABS
7.5000 mg | ORAL_TABLET | Freq: Once | ORAL | Status: AC
  Administered 2011-09-30: 7.5 mg via ORAL
  Filled 2011-09-30: qty 1

## 2011-09-30 MED ORDER — BUPIVACAINE HCL (PF) 0.25 % IJ SOLN
INTRAMUSCULAR | Status: DC | PRN
  Administered 2011-09-30: 30 mL

## 2011-09-30 MED ORDER — MIDAZOLAM HCL 2 MG/2ML IJ SOLN: 0.5000 mg | INTRAMUSCULAR | Status: DC | PRN

## 2011-09-30 MED ORDER — ACETAMINOPHEN 10 MG/ML IV SOLN
1000.0000 mg | Freq: Once | INTRAVENOUS | Status: AC
  Administered 2011-09-30: 1000 mg via INTRAVENOUS
  Filled 2011-09-30: qty 100

## 2011-09-30 MED ORDER — METHOCARBAMOL 500 MG PO TABS
500.0000 mg | ORAL_TABLET | Freq: Four times a day (QID) | ORAL | Status: DC | PRN
  Administered 2011-10-02 (×2): 500 mg via ORAL
  Filled 2011-09-30 (×3): qty 1

## 2011-09-30 MED ORDER — ONDANSETRON HCL 4 MG/2ML IJ SOLN: 4.0000 mg | Freq: Four times a day (QID) | INTRAMUSCULAR | Status: DC | PRN

## 2011-09-30 MED ORDER — DIPHENHYDRAMINE HCL 12.5 MG/5ML PO ELIX: 12.5000 mg | ORAL_SOLUTION | Freq: Four times a day (QID) | ORAL | Status: DC | PRN

## 2011-09-30 MED ORDER — DIPHENHYDRAMINE HCL 50 MG/ML IJ SOLN: 12.5000 mg | Freq: Four times a day (QID) | INTRAMUSCULAR | Status: DC | PRN

## 2011-09-30 MED ORDER — MORPHINE SULFATE 4 MG/ML IJ SOLN
INTRAMUSCULAR | Status: DC | PRN
  Administered 2011-09-30 (×2): 2 mg via INTRAVENOUS

## 2011-09-30 MED ORDER — SODIUM CHLORIDE 0.9 % IJ SOLN: 9.0000 mL | INTRAMUSCULAR | Status: DC | PRN

## 2011-09-30 MED ORDER — SODIUM CHLORIDE 0.9 % IR SOLN
Status: DC | PRN
  Administered 2011-09-30: 1000 mL
  Administered 2011-09-30: 3000 mL

## 2011-09-30 MED ORDER — ACETAMINOPHEN 10 MG/ML IV SOLN
INTRAVENOUS | Status: AC
  Filled 2011-09-30: qty 100

## 2011-09-30 MED ORDER — LACTATED RINGERS IV SOLN
INTRAVENOUS | Status: DC | PRN
  Administered 2011-09-30 (×2): via INTRAVENOUS

## 2011-09-30 MED ORDER — MENTHOL 3 MG MT LOZG: 1.0000 | LOZENGE | OROMUCOSAL | Status: DC | PRN

## 2011-09-30 MED ORDER — MIDAZOLAM HCL 5 MG/5ML IJ SOLN
INTRAMUSCULAR | Status: DC | PRN
  Administered 2011-09-30: 2 mg via INTRAVENOUS

## 2011-09-30 MED ORDER — FENTANYL CITRATE 0.05 MG/ML IJ SOLN
INTRAMUSCULAR | Status: DC | PRN
  Administered 2011-09-30: 100 ug via INTRAVENOUS
  Administered 2011-09-30 (×6): 50 ug via INTRAVENOUS

## 2011-09-30 MED ORDER — HYDROMORPHONE HCL PF 1 MG/ML IJ SOLN
INTRAMUSCULAR | Status: AC
  Filled 2011-09-30: qty 1

## 2011-09-30 MED ORDER — MORPHINE SULFATE (PF) 1 MG/ML IV SOLN
INTRAVENOUS | Status: AC
  Filled 2011-09-30: qty 25

## 2011-09-30 MED ORDER — FENTANYL CITRATE 0.05 MG/ML IJ SOLN: 50.0000 ug | INTRAMUSCULAR | Status: DC | PRN

## 2011-09-30 SURGICAL SUPPLY — 57 items
BANDAGE ESMARK 6X9 LF (GAUZE/BANDAGES/DRESSINGS) ×1 IMPLANT
BLADE SAG 18X100X1.27 (BLADE) ×4 IMPLANT
BNDG CMPR 9X6 STRL LF SNTH (GAUZE/BANDAGES/DRESSINGS) ×1
BNDG ESMARK 6X9 LF (GAUZE/BANDAGES/DRESSINGS) ×2
BOOTCOVER CLEANROOM LRG (PROTECTIVE WEAR) ×4 IMPLANT
BOWL SMART MIX CTS (DISPOSABLE) ×2 IMPLANT
CEMENT BONE SIMPLEX SPEEDSET (Cement) ×4 IMPLANT
CLOTH BEACON ORANGE TIMEOUT ST (SAFETY) ×2 IMPLANT
COVER BACK TABLE 24X17X13 BIG (DRAPES) ×2 IMPLANT
COVER SURGICAL LIGHT HANDLE (MISCELLANEOUS) ×2 IMPLANT
CUFF TOURNIQUET SINGLE 34IN LL (TOURNIQUET CUFF) ×2 IMPLANT
DRAPE EXTREMITY T 121X128X90 (DRAPE) ×2 IMPLANT
DRAPE PROXIMA HALF (DRAPES) ×2 IMPLANT
DRAPE U-SHAPE 47X51 STRL (DRAPES) ×2 IMPLANT
DRSG ADAPTIC 3X8 NADH LF (GAUZE/BANDAGES/DRESSINGS) ×1 IMPLANT
DRSG PAD ABDOMINAL 8X10 ST (GAUZE/BANDAGES/DRESSINGS) ×2 IMPLANT
DURAPREP 26ML APPLICATOR (WOUND CARE) ×2 IMPLANT
ELECT CAUTERY BLADE 6.4 (BLADE) ×2 IMPLANT
ELECT REM PT RETURN 9FT ADLT (ELECTROSURGICAL) ×2
ELECTRODE REM PT RTRN 9FT ADLT (ELECTROSURGICAL) ×1 IMPLANT
EVACUATOR 1/8 PVC DRAIN (DRAIN) ×2 IMPLANT
FACESHIELD LNG OPTICON STERILE (SAFETY) ×2 IMPLANT
GAUZE XEROFORM 5X9 LF (GAUZE/BANDAGES/DRESSINGS) ×2 IMPLANT
GLOVE BIOGEL PI IND STRL 8 (GLOVE) ×1 IMPLANT
GLOVE BIOGEL PI INDICATOR 8 (GLOVE) ×1
GLOVE ORTHO TXT STRL SZ7.5 (GLOVE) ×2 IMPLANT
GOWN PREVENTION PLUS XLARGE (GOWN DISPOSABLE) ×4 IMPLANT
GOWN STRL NON-REIN LRG LVL3 (GOWN DISPOSABLE) ×4 IMPLANT
GOWN STRL REIN 2XL XLG LVL4 (GOWN DISPOSABLE) ×2 IMPLANT
HANDPIECE INTERPULSE COAX TIP (DISPOSABLE) ×2
IMMOBILIZER KNEE 22 UNIV (SOFTGOODS) ×2 IMPLANT
IMMOBILIZER KNEE 24 THIGH 36 (MISCELLANEOUS) IMPLANT
IMMOBILIZER KNEE 24 UNIV (MISCELLANEOUS)
KIT BASIN OR (CUSTOM PROCEDURE TRAY) ×2 IMPLANT
KIT ROOM TURNOVER OR (KITS) ×2 IMPLANT
MANIFOLD NEPTUNE II (INSTRUMENTS) ×2 IMPLANT
NS IRRIG 1000ML POUR BTL (IV SOLUTION) ×2 IMPLANT
PACK TOTAL JOINT (CUSTOM PROCEDURE TRAY) ×2 IMPLANT
PAD ARMBOARD 7.5X6 YLW CONV (MISCELLANEOUS) ×4 IMPLANT
PAD CAST 4YDX4 CTTN HI CHSV (CAST SUPPLIES) ×1 IMPLANT
PADDING CAST COTTON 4X4 STRL (CAST SUPPLIES) ×2
PADDING CAST COTTON 6X4 STRL (CAST SUPPLIES) ×2 IMPLANT
RUBBERBAND STERILE (MISCELLANEOUS) ×2 IMPLANT
SET HNDPC FAN SPRY TIP SCT (DISPOSABLE) ×1 IMPLANT
SPONGE GAUZE 4X4 12PLY (GAUZE/BANDAGES/DRESSINGS) ×2 IMPLANT
STAPLER VISISTAT 35W (STAPLE) ×2 IMPLANT
SUCTION FRAZIER TIP 10 FR DISP (SUCTIONS) ×2 IMPLANT
SUT VIC AB 1 CTX 36 (SUTURE) ×4
SUT VIC AB 1 CTX36XBRD ANBCTR (SUTURE) ×2 IMPLANT
SUT VIC AB 2-0 CT1 27 (SUTURE) ×4
SUT VIC AB 2-0 CT1 TAPERPNT 27 (SUTURE) ×2 IMPLANT
SYR 30ML LL (SYRINGE) ×2 IMPLANT
SYR 30ML SLIP (SYRINGE) ×2 IMPLANT
TOWEL OR 17X24 6PK STRL BLUE (TOWEL DISPOSABLE) ×2 IMPLANT
TOWEL OR 17X26 10 PK STRL BLUE (TOWEL DISPOSABLE) ×2 IMPLANT
TRAY FOLEY CATH 14FR (SET/KITS/TRAYS/PACK) ×2 IMPLANT
WATER STERILE IRR 1000ML POUR (IV SOLUTION) ×4 IMPLANT

## 2011-09-30 NOTE — Anesthesia Procedure Notes (Signed)
Anesthesia Regional Block:  Femoral nerve block  Pre-Anesthetic Checklist: ,, timeout performed, Correct Patient, Correct Site, Correct Laterality, Correct Procedure, Correct Position, site marked, Risks and benefits discussed, pre-op evaluation,  At surgeon's request and post-op pain management  Laterality: Right  Prep: Maximum Sterile Barrier Precautions used and chloraprep       Needles:  Injection technique: Single-shot  Needle Type: Echogenic Stimulator Needle      Needle Gauge: 22 and 22 G    Additional Needles:  Procedures: ultrasound guided and nerve stimulator Femoral nerve block  Nerve Stimulator or Paresthesia:  Response: Patellar respose, 0.4 mA,   Additional Responses:   Narrative:  Start time: 09/30/2011 10:55 AM End time: 09/30/2011 11:06 AM Injection made incrementally with aspirations every 5 mL. Anesthesiologist: Darik Massing,MD  Additional Notes: 2% Lidocaine skin wheel.   Femoral nerve block

## 2011-09-30 NOTE — Plan of Care (Signed)
Problem: Consults Goal: Diagnosis- Total Joint Replacement Primary Total Knee     

## 2011-09-30 NOTE — Anesthesia Preprocedure Evaluation (Addendum)
Anesthesia Evaluation  Patient identified by MRN, date of birth, ID band Patient awake    Reviewed: Allergy & Precautions, H&P , NPO status , Patient's Chart, lab work & pertinent test results  Airway Mallampati: III TM Distance: >3 FB Neck ROM: Full    Dental No notable dental hx. (+) Teeth Intact and Dental Advisory Given   Pulmonary neg pulmonary ROS,  breath sounds clear to auscultation  Pulmonary exam normal       Cardiovascular negative cardio ROS  Rhythm:Regular Rate:Normal     Neuro/Psych  Headaches, negative psych ROS   GI/Hepatic negative GI ROS, Neg liver ROS,   Endo/Other  negative endocrine ROS  Renal/GU negative Renal ROS  negative genitourinary   Musculoskeletal   Abdominal   Peds  Hematology negative hematology ROS (+)   Anesthesia Other Findings   Reproductive/Obstetrics negative OB ROS                           Anesthesia Physical Anesthesia Plan  ASA: II  Anesthesia Plan: General   Post-op Pain Management:    Induction: Intravenous  Airway Management Planned: LMA  Additional Equipment:   Intra-op Plan:   Post-operative Plan: Extubation in OR  Informed Consent: I have reviewed the patients History and Physical, chart, labs and discussed the procedure including the risks, benefits and alternatives for the proposed anesthesia with the patient or authorized representative who has indicated his/her understanding and acceptance.   Dental advisory given  Plan Discussed with: CRNA  Anesthesia Plan Comments:         Anesthesia Quick Evaluation

## 2011-09-30 NOTE — Transfer of Care (Signed)
Immediate Anesthesia Transfer of Care Note  Patient: Jasmine Spencer  Procedure(s) Performed: Procedure(s) (LRB): TOTAL KNEE ARTHROPLASTY (Right)  Patient Location: PACU  Anesthesia Type: GA combined with regional for post-op pain  Level of Consciousness: awake, alert  and patient cooperative  Airway & Oxygen Therapy: Patient Spontanous Breathing and Patient connected to nasal cannula oxygen  Post-op Assessment: Report given to PACU RN, Post -op Vital signs reviewed and stable and Patient moving all extremities  Post vital signs: Reviewed and stable  Complications: No apparent anesthesia complications

## 2011-09-30 NOTE — Progress Notes (Signed)
UR COMPLETED  

## 2011-09-30 NOTE — Brief Op Note (Signed)
09/30/2011  12:54 PM  PATIENT:  Jasmine Spencer  46 y.o. female  PRE-OPERATIVE DIAGNOSIS:  DJD RIGHT KNEE  POST-OPERATIVE DIAGNOSIS:  DJD RIGHT KNEE  PROCEDURE:  Procedure(s) (LRB): TOTAL KNEE ARTHROPLASTY (Right)  SURGEON:  Surgeon(s) and Role:    * Loreta Ave, MD - Primary  PHYSICIAN ASSISTANT: Zonia Kief M   ANESTHESIA:   regional and general  EBL:  Total I/O In: 1200 [I.V.:1200] Out: 300 [Urine:300]  SPECIMEN:  No Specimen  DISPOSITION OF SPECIMEN:  N/A  COUNTS:  YES  TOURNIQUET:   Total Tourniquet Time Documented: Thigh (Right) - 64 minutes   PATIENT DISPOSITION:  PACU - hemodynamically stable.

## 2011-09-30 NOTE — Preoperative (Signed)
Beta Blockers   Reason not to administer Beta Blockers:Not Applicable 

## 2011-09-30 NOTE — Progress Notes (Signed)
ANTICOAGULATION CONSULT NOTE - Initial Consult  Pharmacy Consult for Coumadin Indication: VTE prophylaxis  Allergies  Allergen Reactions  . Milk-Related Compounds Other (See Comments)    Lactose intolerance.  . Penicillins Other (See Comments)    Childhood allergy.  . Latex Rash    Patient Measurements:   Height ~ 63 inches Weight ~ 204 lbs  Vital Signs: Temp: 97.4 F (36.3 C) (08/07 1400) Temp src: Oral (08/07 0920) BP: 112/69 mmHg (08/07 1350) Pulse Rate: 79  (08/07 1350)  Labs: Baseline INR 0.95   Medical History: Past Medical History  Diagnosis Date  . Elevated cholesterol   . Complication of anesthesia     Pt. has panicky feeling  ( feels like she can't breathe)upon awaking fr. anesth. , NEEDS TO BE SITTING UPRIGHT  . Family history of anesthesia complication     pt.'s father is very agitated /w MORPHINE, can tolerate DILAUDID  . H/O hiatal hernia     h/o - several yrs. ago, took Prilosec for 1 yr., no longer a problem   . Headache     migraine- hormone related   . Arthritis     R knee    Medications:  Prescriptions prior to admission  Medication Sig Dispense Refill  . aspirin-acetaminophen-caffeine (EXCEDRIN MIGRAINE) 250-250-65 MG per tablet Take 1 tablet by mouth every 6 (six) hours as needed. For migraine.      . fenofibrate 54 MG tablet Take 54 mg by mouth daily before breakfast.       . naproxen sodium (ANAPROX) 220 MG tablet Take 440 mg by mouth 2 (two) times daily as needed. For leg pain.        Assessment: 46 yo F admitted 09/30/2011 for elective R TKA to begin Coumadin for post-op VTE prophylaxis.  Baseline INR and CBC wnl.  Pregnancy test negative PTA.  Goal of Therapy:  INR 2-3   Plan:  1. Coumadin 7.5mg  PO x 1 tonight 2. Daily INR. 3. Coumadin education materials ordered. 4. D/C Enox when INR >=1.8   [employer], Pharm.D., BCPS Clinical Pharmacist Pager [phone] 09/30/2011 3:46 PM

## 2011-09-30 NOTE — Progress Notes (Signed)
Orthopedic Tech Progress Note Patient Details:  Jasmine Spencer [facility] November 19, 1965 [id number]  CPM Right Knee CPM Right Knee: On Right Knee Flexion (Degrees): 60  Right Knee Extension (Degrees): 0    Jennye Moccasin 09/30/2011, 1:58 PM

## 2011-09-30 NOTE — Interval H&P Note (Signed)
History and Physical Interval Note:  09/30/2011 8:16 AM  Jasmine Spencer  has presented today for surgery, with the diagnosis of DJD RIGHT KNEE  The various methods of treatment have been discussed with the patient and family. After consideration of risks, benefits and other options for treatment, the patient has consented to  Procedure(s) (LRB): TOTAL KNEE ARTHROPLASTY (Right) as a surgical intervention .  The patient's history has been reviewed, patient examined, no change in status, stable for surgery.  I have reviewed the patient's chart and labs.  Questions were answered to the patient's satisfaction.     Dawanda Mapel F

## 2011-09-30 NOTE — Anesthesia Postprocedure Evaluation (Signed)
  Anesthesia Post-op Note  Patient: Jasmine Spencer  Procedure(s) Performed: Procedure(s) (LRB): TOTAL KNEE ARTHROPLASTY (Right)  Patient Location: PACU  Anesthesia Type: GA combined with regional for post-op pain  Level of Consciousness: awake  Airway and Oxygen Therapy: Patient Spontanous Breathing and Patient connected to nasal cannula oxygen  Post-op Pain: mild  Post-op Assessment: Post-op Vital signs reviewed, Patient's Cardiovascular Status Stable, Respiratory Function Stable, Patent Airway and No signs of Nausea or vomiting  Post-op Vital Signs: Reviewed and stable  Complications: No apparent anesthesia complications

## 2011-10-01 LAB — CBC
HCT: 30.4 % — ABNORMAL LOW (ref 36.0–46.0)
Hemoglobin: 10.7 g/dL — ABNORMAL LOW (ref 12.0–15.0)
MCH: 29.9 pg (ref 26.0–34.0)
MCHC: 35.2 g/dL (ref 30.0–36.0)
RBC: 3.58 MIL/uL — ABNORMAL LOW (ref 3.87–5.11)

## 2011-10-01 LAB — BASIC METABOLIC PANEL
BUN: 10 mg/dL (ref 6–23)
CO2: 24 mEq/L (ref 19–32)
Calcium: 9 mg/dL (ref 8.4–10.5)
GFR calc non Af Amer: >90 mL/min (ref >90)
Glucose, Bld: 154 mg/dL — ABNORMAL HIGH (ref 70–99)
Sodium: 136 mEq/L (ref 135–145)

## 2011-10-01 LAB — PROTIME-INR: Prothrombin Time: 14.7 seconds (ref 11.6–15.2)

## 2011-10-01 MED ORDER — OXYCODONE-ACETAMINOPHEN 5-325 MG PO TABS
1.0000 | ORAL_TABLET | ORAL | Status: DC | PRN
  Administered 2011-10-01 – 2011-10-02 (×8): 2 via ORAL
  Filled 2011-10-01 (×8): qty 2

## 2011-10-01 MED ORDER — WARFARIN SODIUM 7.5 MG PO TABS
7.5000 mg | ORAL_TABLET | Freq: Once | ORAL | Status: AC
  Administered 2011-10-01: 7.5 mg via ORAL
  Filled 2011-10-01 (×2): qty 1

## 2011-10-01 NOTE — Op Note (Signed)
[name], [name] NO.:  [id number]  MEDICAL RECORD NO.:  [id number]  LOCATION:  5N14C                        FACILITY:  [facility]  PHYSICIAN:  Loreta Ave, M.D. DATE OF BIRTH:  [birth year]  DATE OF PROCEDURE:  09/30/2011 DATE OF DISCHARGE:                              OPERATIVE REPORT   PREOPERATIVE DIAGNOSES:  Right knee end-stage degenerative arthritis, varus alignment.  Some bone loss, patellofemoral joint, especially laterally.  POSTOPERATIVE DIAGNOSES:  Right knee end-stage degenerative arthritis, varus alignment.  Some bone loss, patellofemoral joint, especially laterally.  PROCEDURE:  Modified minimally invasive right total knee replacement with Stryker Triathlon prosthesis.  Soft tissue balancing.  Cemented pegged posterior stabilized #3 femoral component.  Cemented #3 tibial component with a 9-mm polyethylene insert.  Cemented resurfacing 32 mm patellar component.  SURGEON:  Loreta Ave, M.D.  ASSISTANT:  Genene Churn. Denton Meek., present throughout the entire case, necessary for timely completion of procedure.  ANESTHESIA:  General.  BLOOD LOSS:  Minimal.  SPECIMENS:  None.  CULTURES:  None.  COMPLICATION:  None.  DRESSINGS:  Soft compressive, a knee immobilizer.  DRAINS:  Hemovac x1.  TOURNIQUET TIME:  45 minutes.  PROCEDURE:  The patient was brought to the operating room and placed on the operating table in supine position.  After adequate anesthesia had been obtained, tourniquet applied, prepped and draped in usual sterile fashion.  Exsanguinated with elevation, Esmarch.  Tourniquet inflated to 350 mmHg.  Knee examined.  Varus alignment correctable to neutral. Marked lateral patellofemoral tracking, not much tethering.  Stable ligaments.  Straight incision above the patella down to tibial tubercle. Hemostasis with cautery.  Medial arthrotomy, vastus splitting.  Knee exposed.  Periarticular spurs, remnants of menisci,  cruciate ligaments, loose bodies removed.  Distal femur exposed.  Intramedullary guide.  5 degrees of valgus.  8 mm of resection.  Using epicondylar axis, the femur was sized, cut, and fitted for posterior stabilized pegged #3 component.  Extramedullary guide on the tibia.  3-degree posterior slope cut.  Size #3 component.  Debris cleared throughout the knee in flexion and extension, including posterior recess.  Patella exposed, posterior 10 mm removed.  Spurs removed.  Drilled, sized, and fitted for a 32-mm patellar component.  Trials put in place throughout.  #3 above and below.  9 mm insert and 32 patella.  With this construct, I was very pleased with biomechanical axis balancing in flexion and extension, as well as patellofemoral tracking.  Tibia was marked for rotation and reamed.  All trials removed.  Copious irrigation with a pulse irrigating device.  Cement prepared and placed on all components and firmly seated. Polyethylene attached to tibia and knee reduced.  Patella held with a clamp.  Once cement hardened, the knee was reexamined.  Again, I was pleased with alignment, stability, and motion.  Hemovac was placed and brought through a separate stab wound.  Arthrotomy closed with #1 Vicryl, skin and subcutaneous tissue with Vicryl and staples.  Sterile compressive dressing applied.  Tourniquet deflated and removed.  Knee immobilizer applied.  Anesthesia reversed.  Brought to the recovery room.  Tolerated surgery well, no complications.     Loreta Ave, M.D.  DFM/MEDQ  D:  09/30/2011  T:  10/01/2011  Job:  [id number]

## 2011-10-01 NOTE — Evaluation (Signed)
Physical Therapy Evaluation Patient Details Name: Jasmine Spencer MRN: [id number] DOB: 09/21/1965 Today's Date: 10/01/2011 Time: 7829-5621 PT Time Calculation (min): 26 min  PT Assessment / Plan / Recommendation Clinical Impression  Pt is a 46 y/o female s/p R TKA.  Pt is doing exceptionally well with mobility and should meet acute PT goals quickly.      PT Assessment  Patient needs continued PT services    Follow Up Recommendations  Home health PT    Barriers to Discharge None      Equipment Recommendations  None recommended by PT    Recommendations for Other Services     Frequency 7X/week    Precautions / Restrictions Precautions Precautions: Knee Restrictions Weight Bearing Restrictions: Yes RLE Weight Bearing: Weight bearing as tolerated   Pertinent Vitals/Pain Pain 3-4/10 Right Knee.        Mobility  Bed Mobility Bed Mobility: Supine to Sit;Sitting - Scoot to Edge of Bed Supine to Sit: 5: Supervision;HOB flat Sitting - Scoot to Edge of Bed: 5: Supervision Details for Bed Mobility Assistance: cues for technique no physical assistance required.  Transfers Transfers: Sit to Stand;Stand to Sit Sit to Stand: With upper extremity assist;From bed;4: Min guard;From chair/3-in-1 Stand to Sit: 4: Min guard;With upper extremity assist;To chair/3-in-1 Details for Transfer Assistance: Cues for technique including hand placement and positioning of R LE to minimize pain.  Ambulation/Gait Ambulation/Gait Assistance: 4: Min guard Ambulation Distance (Feet): 60 Feet Assistive device: Rolling walker Ambulation/Gait Assistance Details: Verbal cues for gait sequencing and WBAT on R LE.  Gait Pattern: Step-to pattern;Decreased stance time - right Gait velocity: WFL Stairs: No Wheelchair Mobility Wheelchair Mobility: No Modified Rankin (Stroke Patients Only) Pre-Morbid Rankin Score: No significant disability    Exercises Total Joint Exercises Ankle Circles/Pumps: Both;10  reps;Seated Quad Sets: Both;10 reps;Seated Heel Slides: 10 reps;Supine;Right Straight Leg Raises: 5 reps;Right;Strengthening;Supine Goniometric ROM: 0-80 degree R knee ROM   PT Diagnosis: Abnormality of gait;Acute pain  PT Problem List: Decreased strength;Decreased range of motion;Decreased knowledge of precautions;Pain;Decreased mobility;Decreased activity tolerance PT Treatment Interventions: DME instruction;Gait training;Stair training;Functional mobility training;Therapeutic activities;Therapeutic exercise;Manual techniques;Modalities;Patient/family education   PT Goals Acute Rehab PT Goals PT Goal Formulation: With patient Time For Goal Achievement: 10/08/11 Potential to Achieve Goals: Good Pt will go Supine/Side to Sit: Independently PT Goal: Supine/Side to Sit - Progress: Goal set today Pt will go Sit to Supine/Side: Independently PT Goal: Sit to Supine/Side - Progress: Goal set today Pt will Transfer Bed to Chair/Chair to Bed: Independently PT Transfer Goal: Bed to Chair/Chair to Bed - Progress: Goal set today Pt will Ambulate: >150 feet;with modified independence;with least restrictive assistive device PT Goal: Ambulate - Progress: Goal set today Pt will Go Up / Down Stairs: 3-5 stairs;with supervision;with least restrictive assistive device PT Goal: Up/Down Stairs - Progress: Goal set today Pt will Perform Home Exercise Program: Independently PT Goal: Perform Home Exercise Program - Progress: Goal set today  Visit Information  Last PT Received On: 10/01/11    Subjective Data  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     Prior Functioning  Home Living Lives With: Family Available Help at Discharge: Family Type of Home: House Home Access: Stairs to enter [employer] of Steps: 3 Entrance Stairs-Rails: None Home Layout: Multi-level;Able to live on main level with bedroom/bathroom Bathroom Shower/Tub: Tub/shower unit;Curtain Bathroom Toilet:  Standard Bathroom Accessibility: Yes How Accessible: Accessible via walker Home Adaptive Equipment: Bedside commode/3-in-1;Walker - rolling Prior Function Level of Independence:  Independent Able to Take Stairs?: Yes Driving: Yes Vocation: Full time employment Communication Communication: No difficulties    Cognition  Overall Cognitive Status: Appears within functional limits for tasks assessed/performed Arousal/Alertness: Awake/alert Orientation Level: Appears intact for tasks assessed Behavior During Session: [facility] for tasks performed    Extremity/Trunk Assessment Right Upper Extremity Assessment RUE ROM/Strength/Tone: Within functional levels Left Upper Extremity Assessment LUE ROM/Strength/Tone: Within functional levels Right Lower Extremity Assessment RLE ROM/Strength/Tone: Deficits;Due to precautions;Due to pain Left Lower Extremity Assessment LLE ROM/Strength/Tone: Within functional levels Trunk Assessment Trunk Assessment: Normal   Balance Balance Balance Assessed: No  End of Session PT - End of Session Equipment Utilized During Treatment: Gait belt;Right knee immobilizer Activity Tolerance: Patient tolerated treatment well Patient left: in chair;with call bell/phone within reach;with family/visitor present Nurse Communication: Mobility status;Weight bearing status CPM Right Knee CPM Right Knee: Off  GP     Alexiz Sustaita 10/01/2011, 2:51 PM  Geoff Dacanay L. TRUE Garciamartinez DPT [phone]

## 2011-10-01 NOTE — Progress Notes (Signed)
Subjective: Doing well.  Pain controlled.  No complaints.  Objective: Vital signs in last 24 hours: Temp:  [97.4 F (36.3 C)-98.9 F (37.2 C)] 98.9 F (37.2 C) (08/08 0608) Pulse Rate:  [67-96] 80  (08/08 0608) Resp:  [12-23] 16  (08/08 0608) BP: (96-130)/(64-83) 110/64 mmHg (08/08 0608) SpO2:  [93 %-98 %] 94 % (08/08 0608)  Intake/Output from previous day: 08/07 0701 - 08/08 0700 In: 2178.5 [I.V.:1978.5; IV Piggyback:200] Out: 4100 [Urine:4100] Intake/Output this shift:     Basename 10/01/11 0651  HGB 10.7*    Basename 10/01/11 0651  WBC 11.9*  RBC 3.58*  HCT 30.4*  PLT 252    Basename 10/01/11 0651  NA 136  K 4.2  CL 104  CO2 24  BUN 10  CREATININE 0.64  GLUCOSE 154*  CALCIUM 9.0    Basename 10/01/11 0651  LABPT --  INR 1.13    Exam: dressing c/d/i.  Calf nt, nvi.  Assessment/Plan: D/c pca and foley.  Anticipate d/c home fri or sat.  Start therapy.   Nicolaas Savo M 10/01/2011, 8:47 AM

## 2011-10-01 NOTE — Progress Notes (Signed)
CARE MANAGEMENT NOTE 10/01/2011  Patient:  Jasmine Spencer, Jasmine Spencer   Account Number:  [id number]  Date Initiated:  10/01/2011  Documentation initiated by:  Vance Peper  Subjective/Objective Assessment:   46 yr old female s/p right total knee arthroplasty     Action/Plan:   CM spoke with patient regarding home health needs at discharge. Choice offered. Patient preoperatively setup with [facility], no changes. CPM has been delivered. Patient has roling walker and 3in1. Has support at discharge.   Anticipated DC Date:  10/02/2011   Anticipated DC Plan:  HOME W HOME HEALTH SERVICES      DC Planning Services  CM consult      [facility] Choice  HOME HEALTH   Choice offered to / List presented to:  C-1 Patient        HH arranged  HH-1 RN  HH-2 PT      [facility] agency  [facility]   Status of service:  Completed, signed off Medicare Important Message given?   (If response is "NO", the following Medicare IM given date fields will be blank) Date Medicare IM given:   Date Additional Medicare IM given:    Discharge Disposition:  HOME W HOME HEALTH SERVICES  Per UR Regulation:    If discussed at Long Length of Stay Meetings, dates discussed:    Comments:

## 2011-10-01 NOTE — Progress Notes (Signed)
ANTICOAGULATION CONSULT NOTE - Follow Up Consult  Pharmacy Consult for Coumadin Indication: VTE prophylaxis  Allergies  Allergen Reactions  . Milk-Related Compounds Other (See Comments)    Lactose intolerance.  . Penicillins Other (See Comments)    Childhood allergy.  . Latex Rash    Labs:  [facility] 10/01/11 0651  HGB 10.7*  HCT 30.4*  PLT 252  APTT --  LABPROT 14.7  INR 1.13  HEPARINUNFRC --  CREATININE 0.64  CKTOTAL --  CKMB --  TROPONINI --    The CrCl is unknown because both a height and weight (above a minimum accepted value) are required for this calculation.  Assessment: S/p TKA No bleeding noted INR = 1.13  Goal of Therapy:  INR 2-3 Monitor platelets by anticoagulation protocol: Yes   Plan:  1) Repeat Coumadin 7.5 mg po x 1 2) Follow up AM INR  Thank you. Okey Regal, PharmD [phone]  10/01/2011,9:06 AM

## 2011-10-01 NOTE — Progress Notes (Signed)
Physical Therapy Treatment Patient Details Name: Jasmine Spencer MRN: [id number] DOB: [birth year] Today's Date: 10/01/2011 Time: 1330-1400 PT Time Calculation (min): 30 min  PT Assessment / Plan / Recommendation Comments on Treatment Session  Pt in CPM upon PT arrival Returned pt to CPM after session.  Pt is doing very well and should be appropriate for d/c Home when cleared by MD/    Follow Up Recommendations  Home health PT    Barriers to Discharge        Equipment Recommendations  None recommended by PT    Recommendations for Other Services    Frequency 7X/week   Plan Discharge plan remains appropriate;Frequency remains appropriate    Precautions / Restrictions Precautions Precautions: Knee Precaution Booklet Issued: No Required Braces or Orthoses: Knee Immobilizer - Right Knee Immobilizer - Right: On except when in CPM Restrictions Weight Bearing Restrictions: Yes RLE Weight Bearing: Weight bearing as tolerated   Pertinent Vitals/Pain Pt reports 0/10 pain in R knee.     Mobility  Bed Mobility Bed Mobility: Supine to Sit;Sitting - Scoot to Edge of Bed;Sit to Supine Supine to Sit: HOB flat;6: Modified independent (Device/Increase time) Sitting - Scoot to Edge of Bed: 6: Modified independent (Device/Increase time) Sit to Supine: 6: Modified independent (Device/Increase time);HOB flat Details for Bed Mobility Assistance: No assist or instruction required.  Transfers Transfers: Sit to Stand;Stand to Sit Sit to Stand: 6: Modified independent (Device/Increase time) Stand to Sit: 6: Modified independent (Device/Increase time);To bed Details for Transfer Assistance: Pt demonstrating safe technique.  Ambulation/Gait Ambulation/Gait Assistance: 5: Supervision Ambulation Distance (Feet): 150 Feet Assistive device: Rolling walker Ambulation/Gait Assistance Details: Cues to maintain safe distance from walker.  Gait Pattern: Step-through pattern;Decreased stance time -  right Gait velocity: WFL Stairs: Yes Stairs Assistance: 4: Min assist Stairs Assistance Details (indicate cue type and reason): Instructed pt and roommate in safe technique for stair negotiation.  Stair Management Technique: No rails;Backwards;With walker Number of Stairs: 3  Wheelchair Mobility Wheelchair Mobility: No    Exercises     PT Diagnosis:    PT Problem List:   PT Treatment Interventions:     PT Goals Acute Rehab PT Goals PT Goal Formulation: With patient Time For Goal Achievement: 10/08/11 Potential to Achieve Goals: Good Pt will go Supine/Side to Sit: Independently PT Goal: Supine/Side to Sit - Progress: Progressing toward goal Pt will go Sit to Supine/Side: Independently PT Goal: Sit to Supine/Side - Progress: Progressing toward goal Pt will Transfer Bed to Chair/Chair to Bed: Independently PT Transfer Goal: Bed to Chair/Chair to Bed - Progress: Progressing toward goal Pt will Ambulate: >150 feet;with modified independence;with least restrictive assistive device PT Goal: Ambulate - Progress: Progressing toward goal Pt will Go Up / Down Stairs: 3-5 stairs;with supervision;with least restrictive assistive device PT Goal: Up/Down Stairs - Progress: Progressing toward goal Pt will Perform Home Exercise Program: Independently  Visit Information  Last PT Received On: 10/01/11    Subjective Data  Subjective: I am ready 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     Cognition  Overall Cognitive Status: Appears within functional limits for tasks assessed/performed Arousal/Alertness: Awake/alert Orientation Level: Appears intact for tasks assessed Behavior During Session: [facility] for tasks performed    Balance  Balance Balance Assessed: No  End of Session PT - End of Session Equipment Utilized During Treatment: Gait belt;Right knee immobilizer Activity Tolerance: Patient tolerated treatment well Patient left: in bed;in CPM;with call bell/phone within  reach Nurse Communication:  Mobility status   GP     Jasmine Spencer 10/01/2011, 3:54 PM Jasmine Spencer L. Fannie Alomar DPT [phone]

## 2011-10-02 ENCOUNTER — Inpatient Hospital Stay (HOSPITAL_COMMUNITY): Payer: BC Managed Care – PPO

## 2011-10-02 ENCOUNTER — Encounter (HOSPITAL_COMMUNITY): Payer: Self-pay | Admitting: Orthopedic Surgery

## 2011-10-02 LAB — CBC
MCH: 30.3 pg (ref 26.0–34.0)
MCHC: 35.6 g/dL (ref 30.0–36.0)
MCV: 85 fL (ref 78.0–100.0)
Platelets: 206 10*3/uL (ref 150–400)
RDW: 12.7 % (ref 11.5–15.5)
WBC: 8.5 10*3/uL (ref 4.0–10.5)

## 2011-10-02 LAB — HEMOGLOBIN AND HEMATOCRIT, BLOOD: Hemoglobin: 9.6 g/dL — ABNORMAL LOW (ref 12.0–15.0)

## 2011-10-02 LAB — BASIC METABOLIC PANEL
Calcium: 8.5 mg/dL (ref 8.4–10.5)
Chloride: 101 mEq/L (ref 96–112)
Creatinine, Ser: 0.63 mg/dL (ref 0.50–1.10)
GFR calc Af Amer: >90 mL/min (ref >90)
GFR calc non Af Amer: >90 mL/min (ref >90)

## 2011-10-02 MED ORDER — METHOCARBAMOL 500 MG PO TABS: 500.0000 mg | ORAL_TABLET | Freq: Four times a day (QID) | ORAL | Status: AC | PRN

## 2011-10-02 MED ORDER — ENOXAPARIN SODIUM 30 MG/0.3ML ~~LOC~~ SOLN: 30.0000 mg | Freq: Two times a day (BID) | SUBCUTANEOUS | Status: DC

## 2011-10-02 MED ORDER — HYDROCODONE-ACETAMINOPHEN 10-325 MG PO TABS
1.0000 | ORAL_TABLET | ORAL | Status: DC | PRN
  Administered 2011-10-02 (×2): 2 via ORAL
  Administered 2011-10-03: 1 via ORAL
  Administered 2011-10-03: 2 via ORAL
  Filled 2011-10-02 (×4): qty 2

## 2011-10-02 MED ORDER — WARFARIN SODIUM 10 MG PO TABS
10.0000 mg | ORAL_TABLET | Freq: Once | ORAL | Status: AC
  Administered 2011-10-02: 10 mg via ORAL
  Filled 2011-10-02: qty 1

## 2011-10-02 MED ORDER — OXYCODONE-ACETAMINOPHEN 7.5-325 MG PO TABS
1.0000 | ORAL_TABLET | ORAL | Status: AC | PRN
Start: 2011-10-02 — End: 2011-10-12

## 2011-10-02 MED ORDER — WARFARIN SODIUM 5 MG PO TABS: 5.0000 mg | ORAL_TABLET | Freq: Every day | ORAL | Status: DC

## 2011-10-02 NOTE — Progress Notes (Signed)
Referral received for SNF. Chart reviewed and CSW has spoken with RNCM who indicates that patient is for DC to home with Home Health and DME.  CSW to sign off. Please re-consult if CSW needs arise.  Karter Haire T. Febe Champa, LCSWA  [phone]  

## 2011-10-02 NOTE — Progress Notes (Signed)
Occupational Therapy Evaluation Patient Details Name: Jasmine Spencer MRN: [id number] DOB: 08-06-1965 Today's Date: 10/02/2011 Time: 1478-2956 OT Time Calculation (min): 24 min  OT Assessment / Plan / Recommendation Clinical Impression  Pt s/p R TKA thus affecting PLOF. Pt able to perform functional transfers safely at supervision level.  Min assist for LB ADLs but will have necessary level of assist at home.  All education completed. No further acute OT needs.      OT Assessment  Patient does not need any further OT services    Follow Up Recommendations  No OT follow up;Supervision/Assistance - 24 hour    Barriers to Discharge      Equipment Recommendations  None recommended by OT    Recommendations for Other Services    Frequency       Precautions / Restrictions Precautions Precautions: Knee Required Braces or Orthoses: Knee Immobilizer - Right Knee Immobilizer - Right: On except when in CPM Restrictions Weight Bearing Restrictions: Yes RLE Weight Bearing: Weight bearing as tolerated   Pertinent Vitals/Pain See vitals    ADL  Grooming: Performed;Supervision/safety Where Assessed - Grooming: Unsupported standing Lower Body Bathing: Simulated;Minimal assistance Where Assessed - Lower Body Bathing: Unsupported sit to stand Lower Body Dressing: Simulated;Minimal assistance Where Assessed - Lower Body Dressing: Unsupported sit to stand Toilet Transfer: Performed;Supervision/safety Toilet Transfer Method: Sit to stand (ambulating) Acupuncturist: Comfort height toilet Toileting - Clothing Manipulation and Hygiene: Performed;Modified independent Where Assessed - Toileting Clothing Manipulation and Hygiene: Sit to stand from 3-in-1 or toilet Equipment Used: Knee Immobilizer;Rolling walker;Gait belt Transfers/Ambulation Related to ADLs: Min guard for safety with RW throughout room. Pt independently demonstrated correct technique and sequencing. ADL Comments:  Recommended use of 3n1 as shower seat in walk in shower. Instructed pt on threading RLE through clothing first when performing LB dressing.      OT Diagnosis:    OT Problem List:   OT Treatment Interventions:     OT Goals    Visit Information  Last OT Received On: 10/02/11 Assistance Needed: +1    Subjective Data      Prior Functioning  Vision/Perception  Home Living Lives With: Family Available Help at Discharge: Family;Friend(s);Available 24 hours/day Type of Home: House Home Access: Stairs to enter [employer] of Steps: 3 Entrance Stairs-Rails: None Home Layout: Multi-level;Able to live on main level with bedroom/bathroom Bathroom Shower/Tub: Tub/shower unit;Walk-in shower Bathroom Toilet: Standard Bathroom Accessibility: Yes How Accessible: Accessible via walker Home Adaptive Equipment: Bedside commode/3-in-1;Walker - rolling Additional Comments: tub/walk in shower are upstairs but pt plans to sponge bathe until she begins going upstairs. 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      Cognition  Overall Cognitive Status: Appears within functional limits for tasks assessed/performed Arousal/Alertness: Awake/alert Orientation Level: Appears intact for tasks assessed Behavior During Session: [facility] for tasks performed    Extremity/Trunk Assessment Right Upper Extremity Assessment RUE ROM/Strength/Tone: Within functional levels Left Upper Extremity Assessment LUE ROM/Strength/Tone: Within functional levels   Mobility Bed Mobility Bed Mobility: Supine to Sit;Sit to Supine;Sitting - Scoot to Edge of Bed Supine to Sit: 6: Modified independent (Device/Increase time);HOB flat Sitting - Scoot to Edge of Bed: 6: Modified independent (Device/Increase time) Details for Bed Mobility Assistance: Mod I for increased time. Transfers Transfers: Sit to Stand;Stand  to Sit Sit to Stand: 6: Modified independent (Device/Increase time);From bed;With upper extremity assist;From toilet Stand to Sit: 6: Modified independent (Device/Increase  time);To chair/3-in-1;To toilet;With upper extremity assist Details for Transfer Assistance: Independently demonstrated safe technique and hand placement. Required increased time.   Exercise    Balance    End of Session OT - End of Session Equipment Utilized During Treatment: Gait belt;Right knee immobilizer Activity Tolerance: Patient tolerated treatment well Patient left: in chair;with call bell/phone within reach;with family/visitor present Nurse Communication: Mobility status  GO    10/02/2011 Cipriano Mile OTR/L Pager [phone] Office [phone]  Cipriano Mile 10/02/2011, 1:28 PM

## 2011-10-02 NOTE — Progress Notes (Signed)
Subjective: Doing ok.  C/o knee pain.  Did well with therapy.  Wants to go home.   Objective: Vital signs in last 24 hours: Temp:  [98.7 F (37.1 C)-99.6 F (37.6 C)] 99.6 F (37.6 C) (08/09 0704) Pulse Rate:  [65-98] 98  (08/09 0704) Resp:  [16-18] 18  (08/09 1200) BP: (113-135)/(42-66) 135/66 mmHg (08/09 0704) SpO2:  [94 %-97 %] 96 % (08/09 1200)  Intake/Output from previous day: 08/08 0701 - 08/09 0700 In: 360 [P.O.:360] Out: 400 [Urine:350; Drains:50] Intake/Output this shift: Total I/O In: -  Out: 50 [Drains:50]   Basename 10/02/11 0535 10/01/11 0651  HGB 9.9* 10.7*    Basename 10/02/11 0535 10/01/11 0651  WBC 8.5 11.9*  RBC 3.27* 3.58*  HCT 27.8* 30.4*  PLT 206 252    Basename 10/02/11 0535 10/01/11 0651  NA 137 136  K 3.7 4.2  CL 101 104  CO2 27 24  BUN 9 10  CREATININE 0.63 0.64  GLUCOSE 114* 154*  CALCIUM 8.5 9.0    Basename 10/02/11 0535 10/01/11 0651  LABPT -- --  INR 1.10 1.13    Exam:  Wound looks good.  Staples intact.  No signs of infection.  Calf nt, nvi.  Drain removed. Assessment/Plan: Anticipate d/c home today.   Saline lock iv.   Nathin Saran M 10/02/2011, 12:43 PM

## 2011-10-02 NOTE — Progress Notes (Signed)
Physical Therapy Treatment Patient Details Name: Jasmine Spencer MRN: [id number] DOB: 1966/01/21 Today's Date: 10/02/2011 Time: 7829-5621 PT Time Calculation (min): 38 min  PT Assessment / Plan / Recommendation Comments on Treatment Session  Pt pain and nausea limiting mobility.  Pt vomitted during both PT sessions today. Pt reports feeling weak and dizzy.   Pt's Hgb down from yesterday. Suggested to RN to request a CBC to rule out anemia. Would not recommend pt d/c to home today.      Follow Up Recommendations  Home health PT    Barriers to Discharge        Equipment Recommendations  None recommended by OT;None recommended by PT    Recommendations for Other Services    Frequency 7X/week   Plan Discharge plan remains appropriate;Frequency remains appropriate    Precautions / Restrictions Precautions Precautions: Knee Required Braces or Orthoses: Knee Immobilizer - Right Knee Immobilizer - Right: On except when in CPM Restrictions Weight Bearing Restrictions: Yes RLE Weight Bearing: Weight bearing as tolerated   Pertinent Vitals/Pain Pt reports pain at 7/10 in R knee.  Pt premedicated and RN notified. Performed grade II A-P joint mobilization to R knee for pain relief.      Mobility  Bed Mobility Bed Mobility: Supine to Sit;Sit to Supine;Sitting - Scoot to Edge of Bed Supine to Sit: 4: Min assist Sit to Supine: 4: Min assist Details for Bed Mobility Assistance: Min assist for R LE.   Transfers Transfers: Sit to Stand;Stand to Sit Sit to Stand: 5: Supervision;From bed;From chair/3-in-1;With upper extremity assist Stand to Sit: 5: Supervision;With upper extremity assist;To bed;To chair/3-in-1 Details for Transfer Assistance: Supervision for safety secondary to pt feeling woozy and weak.  Ambulation/Gait Ambulation/Gait Assistance: 5: Supervision Ambulation Distance (Feet): 100 Feet Assistive device: Rolling walker Ambulation/Gait Assistance Details: supevision secondary to  pt feeling weak  Gait Pattern: Step-through pattern;Decreased stance time - right Gait velocity: WFL Stairs: No    Exercises Total Joint Exercises Ankle Circles/Pumps: Both;10 reps;Seated Quad Sets: Both;10 reps;Seated Short Arc Quad: Right;5 reps Heel Slides: 5 reps;Right Straight Leg Raises: 5 reps;AAROM;Strengthening;Supine;Right Knee Flexion: 5 reps;AAROM;Right;Seated Goniometric ROM: 8 -70 degrees AAROM R knee with pain at end range.    PT Diagnosis:    PT Problem List:   PT Treatment Interventions:     PT Goals Acute Rehab PT Goals PT Goal Formulation: With patient Time For Goal Achievement: 10/08/11 Potential to Achieve Goals: Good Pt will go Supine/Side to Sit: Independently PT Goal: Supine/Side to Sit - Progress: Progressing toward goal Pt will go Sit to Supine/Side: Independently PT Goal: Sit to Supine/Side - Progress: Progressing toward goal Pt will Transfer Bed to Chair/Chair to Bed: Independently PT Transfer Goal: Bed to Chair/Chair to Bed - Progress: Progressing toward goal Pt will Ambulate: >150 feet;with modified independence;with least restrictive assistive device PT Goal: Ambulate - Progress: Progressing toward goal Pt will Go Up / Down Stairs: 3-5 stairs;with supervision;with least restrictive assistive device Pt will Perform Home Exercise Program: Independently PT Goal: Perform Home Exercise Program - Progress: Progressing toward goal  Visit Information  Last PT Received On: 10/02/11 Assistance Needed: +1    Subjective Data  Subjective: I still feel sick to my stomach  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     Cognition  Overall Cognitive Status: Appears within functional limits for tasks assessed/performed Arousal/Alertness: Awake/alert Orientation Level: Appears intact for tasks assessed Behavior During Session: [facility] for tasks performed    Balance  Balance  Balance Assessed: No  End of Session PT - End of Session Equipment  Utilized During Treatment: Gait belt;Right knee immobilizer Activity Tolerance: Patient limited by pain;Treatment limited secondary to medical complications (Comment) Patient left: in bed;with call bell/phone within reach Nurse Communication: Mobility status   GP     Chace Bisch 10/02/2011, 5:24 PM Kedric Bumgarner L. Lorenia Hoston DPT [phone]

## 2011-10-02 NOTE — Progress Notes (Signed)
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    Precautions  Precautions Knee  Precaution Booklet Issued No  Required Braces or Orthoses Knee Immobilizer - Right  Knee Immobilizer - Right On except when in CPM  Restrictions  Weight Bearing Restrictions Yes  RLE Weight Bearing WBAT  Cognition  Overall Cognitive Status Appears within functional limits for tasks assessed/performed  Arousal/Alertness Awake/alert  Orientation Level Appears intact for tasks assessed  Behavior During Session [facility] for tasks performed  Bed Mobility  Bed Mobility Not assessed  Transfers  Transfers Not assessed  Ambulation/Gait  Ambulation/Gait Assistance Not tested (comment)  Wheelchair Mobility  Wheelchair Mobility No  Balance  Balance Assessed No  Exercises  Exercises Total Joint  Total Joint Exercises  Ankle Circles/Pumps Both;10 reps;Seated  Quad Sets Both;10 reps;Seated  Heel Slides 5 reps;Right  Goniometric ROM 10-60 degrees AAROM in R knee limited by pain  Short Arc Quad Right;5 reps  PT - End of Session  Activity Tolerance Patient limited by pain;Treatment limited secondary to medical complications (Comment) (Nauseated)  Patient left in bed;in CPM;with call bell/phone within reach  Nurse Communication Mobility status  PT - Assessment/Plan  Comments on Treatment Session Pt unable to tolerate OOB activity secondary to nausea and vomitting.  Placed pt in CPM after session.  0- 45 degrees.    PT Plan Discharge plan remains appropriate;Frequency remains appropriate  PT Frequency 7X/week  Follow Up Recommendations Home health PT  Equipment Recommended None recommended by PT  Acute Rehab PT Goals  PT Goal Formulation With  patient  Time For Goal Achievement 10/08/11  Potential to Achieve Goals Good  Pt will go Supine/Side to Sit Independently  Pt will Perform Home Exercise Program Independently  PT Goal: Perform Home Exercise Program - Progress Progressing toward goal  PT General Charges  $$ ACUTE PT VISIT 1 Procedure  PT Treatments  $Therapeutic Exercise 8-22 mins   Halina Asano L. Cayle Thunder DPT [phone]

## 2011-10-02 NOTE — Progress Notes (Signed)
ANTICOAGULATION CONSULT NOTE - Follow Up Consult  Pharmacy Consult for Coumadin Indication: VTE prophylaxis  Allergies  Allergen Reactions  . Milk-Related Compounds Other (See Comments)    Lactose intolerance.  . Penicillins Other (See Comments)    Childhood allergy.  . Latex Rash    Labs:  Basename 10/02/11 0535 10/01/11 0651  HGB 9.9* 10.7*  HCT 27.8* 30.4*  PLT 206 252  APTT -- --  LABPROT 14.4 14.7  INR 1.10 1.13  HEPARINUNFRC -- --  CREATININE 0.63 0.64  CKTOTAL -- --  CKMB -- --  TROPONINI -- --    The CrCl is unknown because both a height and weight (above a minimum accepted value) are required for this calculation.  Assessment: S/p TKA No bleeding noted INR = 1.1  Goal of Therapy:  INR 2-3 Monitor platelets by anticoagulation protocol: Yes   Plan:  1) Coumadin 10 mg po x 1 2) Follow up AM INR  Thank you. Okey Regal, PharmD [phone]  10/02/2011,9:11 AM

## 2011-10-03 DIAGNOSIS — E871 Hypo-osmolality and hyponatremia: Secondary | ICD-10-CM | POA: Diagnosis not present

## 2011-10-03 LAB — CBC
HCT: 27.2 % — ABNORMAL LOW (ref 36.0–46.0)
MCH: 29.2 pg (ref 26.0–34.0)
MCHC: 34.9 g/dL (ref 30.0–36.0)
MCV: 83.7 fL (ref 78.0–100.0)
RDW: 12.5 % (ref 11.5–15.5)

## 2011-10-03 LAB — BASIC METABOLIC PANEL
BUN: 9 mg/dL (ref 6–23)
Creatinine, Ser: 0.6 mg/dL (ref 0.50–1.10)
GFR calc Af Amer: >90 mL/min (ref >90)
GFR calc non Af Amer: >90 mL/min (ref >90)
Glucose, Bld: 124 mg/dL — ABNORMAL HIGH (ref 70–99)

## 2011-10-03 MED ORDER — PROMETHAZINE HCL 25 MG PO TABS
25.0000 mg | ORAL_TABLET | Freq: Four times a day (QID) | ORAL | Status: DC | PRN
  Administered 2011-10-03: 25 mg via ORAL
  Filled 2011-10-03: qty 1

## 2011-10-03 MED ORDER — HYDROCODONE-ACETAMINOPHEN 10-325 MG PO TABS: 1.0000 | ORAL_TABLET | ORAL | Status: AC | PRN

## 2011-10-03 MED ORDER — SODIUM CHLORIDE 0.9 % IV BOLUS (SEPSIS)
500.0000 mL | Freq: Once | INTRAVENOUS | Status: AC
  Administered 2011-10-03: 500 mL via INTRAVENOUS

## 2011-10-03 MED ORDER — WARFARIN SODIUM 5 MG PO TABS
5.0000 mg | ORAL_TABLET | ORAL | Status: AC
  Administered 2011-10-03: 5 mg via ORAL
  Filled 2011-10-03: qty 1

## 2011-10-03 MED ORDER — PROMETHAZINE HCL 25 MG PO TABS: 25.0000 mg | ORAL_TABLET | Freq: Four times a day (QID) | ORAL | Status: DC | PRN

## 2011-10-03 NOTE — Progress Notes (Signed)
D/C instructions reviewed with patient and friend. RX x 2 given to pt. Other RX called into pharmacy. hh equipment at home. hh services arranged with [facility]. Coumadin dose given prior to d/c. pts n/v improved prior to d/c. All questions answered. lovenox teaching and instruction kit given to friend. Pt d/c'ed via wheelchair in stable condition

## 2011-10-03 NOTE — Progress Notes (Signed)
Subjective: 3 Days Post-Op Procedure(s) (LRB): TOTAL KNEE ARTHROPLASTY (Right) Patient reports pain as 3 on 0-10 scale.   Complains of increasing headache Continued nausea  Objective: Vital signs in last 24 hours: Temp:  [97 F (36.1 C)-98.8 F (37.1 C)] 98.7 F (37.1 C) (08/10 0643) Pulse Rate:  [80-92] 90  (08/10 0643) Resp:  [18-20] 20  (08/10 0643) BP: (106-114)/(52-73) 106/52 mmHg (08/10 0643) SpO2:  [92 %-99 %] 98 % (08/10 0643)  Intake/Output from previous day: 08/09 0701 - 08/10 0700 In: 240 [P.O.:240] Out: 50 [Drains:50] Intake/Output this shift: Total I/O In: 120 [P.O.:120] Out: -    Basename 10/03/11 0640 10/02/11 1602 10/02/11 0535 10/01/11 0651  HGB 9.5* 9.6* 9.9* 10.7*    Basename 10/03/11 0640 10/02/11 1602 10/02/11 0535  WBC 7.7 -- 8.5  RBC 3.25* -- 3.27*  HCT 27.2* 27.2* --  PLT 224 -- 206    Basename 10/03/11 0640 10/02/11 0535  NA 128* 137  K 3.9 3.7  CL 93* 101  CO2 29 27  BUN 9 9  CREATININE 0.60 0.63  GLUCOSE 124* 114*  CALCIUM 8.7 8.5    Basename 10/03/11 0640 10/02/11 0535  LABPT -- --  INR 1.86* 1.10    Neurovascular intact Incision: no drainage Compartment soft abdomen soft  Assessment/Plan: 3 Days Post-Op Procedure(s) (LRB): TOTAL KNEE ARTHROPLASTY (Right) Discharge home with home health if nausea/ emesis improves Iv bolus to correct hyponatremia, encourage salt intake Adding phenergan oral to decrease nausea from pain meds  Jasmine Spencer B 10/03/2011, 10:42 AM

## 2011-10-03 NOTE — Progress Notes (Addendum)
ANTICOAGULATION CONSULT NOTE - Follow Up Consult  Pharmacy Consult for Coumadin Indication: VTE prophylaxis  Allergies  Allergen Reactions  . Milk-Related Compounds Other (See Comments)    Lactose intolerance.  . Penicillins Other (See Comments)    Childhood allergy.  . Latex Rash    Labs:  Basename 10/03/11 0640 10/02/11 1602 10/02/11 0535 10/01/11 0651  HGB 9.5* 9.6* -- --  HCT 27.2* 27.2* 27.8* --  PLT 224 -- 206 252  APTT -- -- -- --  LABPROT 21.8* -- 14.4 14.7  INR 1.86* -- 1.10 1.13  HEPARINUNFRC -- -- -- --  CREATININE 0.60 -- 0.63 0.64  CKTOTAL -- -- -- --  CKMB -- -- -- --  TROPONINI -- -- -- --    The CrCl is unknown because both a height and weight (above a minimum accepted value) are required for this calculation.  Assessment: S/p TKA No bleeding noted INR still subtherapeutic at 1.86. Will give one more dose of Warfarin before discharge  Goal of Therapy:  INR 2-3 Monitor platelets by anticoagulation protocol: Yes   Plan:  1) Coumadin 5mg  PO x 1 dose stat 2) Continue Lovenox 30 q12h as outpatient 3) F/u INR as outpatient 4) D/C Lovenox when INR between 2-3 per new discharge orders  [facility], Pharm.D. Clinical Pharmacist   Pager: [phone] Phone: [phone] 10/03/2011 12:07 PM

## 2011-10-03 NOTE — Progress Notes (Signed)
Physical Therapy Treatment Patient Details Name: Jasmine Spencer MRN: [id number] DOB: Apr 26, 1965 Today's Date: 10/03/2011 Time: 4098-1191 PT Time Calculation (min): 23 min  PT Assessment / Plan / Recommendation Comments on Treatment Session  Pt admitted s/p right TKA and continues to be motivated.  Pt limited by nausea, but still able to increase ambulation distance as well as independence with mobility.  Pt ready for safe d/c home once medically cleared by MD.    Follow Up Recommendations  Home health PT    Barriers to Discharge        Equipment Recommendations  None recommended by OT;None recommended by PT    Recommendations for Other Services    Frequency 7X/week   Plan Discharge plan remains appropriate;Frequency remains appropriate    Precautions / Restrictions Precautions Precautions: Knee Required Braces or Orthoses: Knee Immobilizer - Right Knee Immobilizer - Right: On except when in CPM Restrictions Weight Bearing Restrictions: Yes RLE Weight Bearing: Weight bearing as tolerated   Pertinent Vitals/Pain None    Mobility  Bed Mobility Bed Mobility: Not assessed Ambulation/Gait Ambulation/Gait Assistance: 5: Supervision Ambulation Distance (Feet): 150 Feet Assistive device: Rolling walker Ambulation/Gait Assistance Details: Verbal cues for step-through sequence as well as initial contact on right heel. Gait Pattern: Step-through pattern;Decreased step length - left;Decreased stance time - right Gait velocity: WFL Stairs: Yes Stairs Assistance: 4: Min assist Stairs Assistance Details (indicate cue type and reason): Assist to steady RW for sequence backward using RW as well as cues for "up with good, down with bad." Stair Management Technique: Step to pattern;Backwards;With walker Number of Stairs: 2  Wheelchair Mobility Wheelchair Mobility: No    Exercises     PT Diagnosis:    PT Problem List:   PT Treatment Interventions:     PT Goals Acute Rehab PT  Goals PT Goal Formulation: With patient Time For Goal Achievement: 10/08/11 Potential to Achieve Goals: Good PT Goal: Ambulate - Progress: Progressing toward goal PT Goal: Up/Down Stairs - Progress: Progressing toward goal  Visit Information  Last PT Received On: 10/03/11 Assistance Needed: +1    Subjective Data  Subjective: "I was expecting pain, but not any nausea." 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     Cognition  Overall Cognitive Status: Appears within functional limits for tasks assessed/performed Arousal/Alertness: Awake/alert Orientation Level: Appears intact for tasks assessed Behavior During Session: [facility] for tasks performed    Balance  Balance Balance Assessed: No  End of Session PT - End of Session Equipment Utilized During Treatment: Gait belt;Right knee immobilizer Activity Tolerance: Patient tolerated treatment well Patient left: in chair;with call bell/phone within reach (On 3-N-1 in bathroom.  RN aware.) Nurse Communication: Mobility status   GP     Cephus Shelling 10/03/2011, 8:32 AM  10/03/2011 Cephus Shelling, PT, DPT [phone]

## 2011-10-03 NOTE — Discharge Summary (Signed)
Physician Discharge Summary  Patient ID: Jasmine Spencer MRN: [id number] DOB/AGE: 1966/02/14 46 y.o.  Admit date: 09/30/2011 Discharge date: 10/03/2011  Admission Diagnoses:right knee osteoarthritis  Discharge Diagnoses:right knee osteoarthritis  Active Problems:  Hyponatremia   Discharged Condition: stable  Hospital Course: PREOPERATIVE DIAGNOSES: Right knee end-stage degenerative arthritis,  varus alignment. Some bone loss, patellofemoral joint, especially  laterally.  POSTOPERATIVE DIAGNOSES: Right knee end-stage degenerative arthritis,  varus alignment. Some bone loss, patellofemoral joint, especially  laterally.  PROCEDURE: Modified minimally invasive right total knee replacement  with [employer]ker Triathlon prosthesis. Soft tissue balancing. Cemented  pegged posterior stabilized #3 femoral component. Cemented #3 tibial  component with a 9-mm polyethylene insert. Cemented resurfacing 32 mm  patellar component.  Patients post operative course notable for nausea, hyponatremia.  Consults: None  Significant Diagnostic Studies: radiology: KUB: neg for ileus  Treatments: IV hydration and therapies: PT and OT  Discharge Exam: Blood pressure 106/52, pulse 90, temperature 98.7 F (37.1 C), temperature source Oral, resp. rate 20, last menstrual period 09/20/2011, SpO2 98.00%. Incision/Wound: clean and dry Bowel sounds present but hypoactive Weight bearing as tolerated Disposition:   Discharge Orders    Future Orders Please Complete By Expires   Diet - low sodium heart healthy      Diet - low sodium heart healthy      Call MD / Call 911      Comments:   If you experience chest pain or shortness of breath, CALL 911 and be transported to the hospital emergency room.  If you develope a fever above 101 F, pus (white drainage) or increased drainage or redness at the wound, or calf pain, call your surgeon's office.   Constipation Prevention      Comments:   Drink plenty of fluids.   Prune juice may be helpful.  You may use a stool softener, such as Colace (over the counter) 100 mg twice a day.  Use MiraLax (over the counter) for constipation as needed.   Increase activity slowly as tolerated      Discharge instructions      Comments:   Ok to shower, but no tub soaking.  Do not apply any creams or ointments to incision.  Continue physical therapy protocol.  Scripts for percocet 7.5/325 1-2 po q4-6hrs prn pain, robaxin 500mg  1 po q6 hrs prn spasms, coumadin pharmacy protocol ([facility] WILL DOSE DAILY), and lovenox 30mg  1 sq injection q 12hrs.  Stop lovenox when coumadin therapeutic with INR 2-3.  SCRIPTS FOR LOVENOX, COUMADIN AND ROBAXIN CALLED IN TO YOUR [facility] AND MUST BE PICKED UP TODAY.   Driving restrictions      Comments:   No driving until further notice.   CPM      Comments:   Continuous passive motion machine (CPM):      Use the CPM from 0 to 70 degrees for 6-8 hours per day.      You may increase by 10 degrees per day as tolerated.  You may break it up into 2 or 3 sessions per day.      Use CPM for 3-4 weeks or until you are told to stop.   TED hose      Comments:   Use stockings (TED hose) for 3-4 weeks on both leg(s).  You may remove them at night for sleeping.   Change dressing      Comments:   Change dressing on left knee daily with sterile 4 x 4 inch gauze  dressing and apply TED hose.   Do not put a pillow under the knee. Place it under the heel.      Call MD / Call 911      Comments:   If you experience chest pain or shortness of breath, CALL 911 and be transported to the hospital emergency room.  If you develope a fever above 101 F, pus (white drainage) or increased drainage or redness at the wound, or calf pain, call your surgeon's office.   Constipation Prevention      Comments:   Drink plenty of fluids.  Prune juice may be helpful.  You may use a stool softener, such as Colace (over the counter) 100 mg twice a day.  Use  MiraLax (over the counter) for constipation as needed.   Increase activity slowly as tolerated      Discharge instructions      Comments:   Follow up with Dr Eulah Pont as directed, call for appointment if not already scheduled   Driving restrictions      Comments:   No driving for 6 weeks   Lifting restrictions      Comments:   No lifting for 6 weeks   CPM      Comments:   Continuous passive motion machine (CPM):      Use the CPM from 0 to 75 for 6-8 hours per day.      You may increase by 5-10 degrees per day.  You may break it up into 2 or 3 sessions per day.      Use CPM for 2 weeks or until you are told to stop.   TED hose      Comments:   Use stockings (TED hose) for 6 weeks on both leg(s).  You may remove them at night for sleeping.   Change dressing      Comments:   Change dressing on sunday, then change the dressing daily with sterile 4 x 4 inch gauze dressing and apply TED hose.  You may clean the incision with alcohol prior to redressing.   Do not put a pillow under the knee. Place it under the heel.        Medication List  As of 10/03/2011 11:21 AM   STOP taking these medications         naproxen sodium 220 MG tablet         TAKE these medications         aspirin-acetaminophen-caffeine 250-250-65 MG per tablet   Commonly known as: EXCEDRIN MIGRAINE   Take 1 tablet by mouth every 6 (six) hours as needed. For migraine.      enoxaparin 30 MG/0.3ML injection   Commonly known as: LOVENOX   Inject 0.3 mLs (30 mg total) into the skin every 12 (twelve) hours.      fenofibrate 54 MG tablet   Take 54 mg by mouth daily before breakfast.      HYDROcodone-acetaminophen 10-325 MG per tablet   Commonly known as: NORCO   Take 1-2 tablets by mouth every 4 (four) hours as needed (pain).      methocarbamol 500 MG tablet   Commonly known as: ROBAXIN   Take 1 tablet (500 mg total) by mouth 4 (four) times daily as needed (SPASMS).      oxyCODONE-acetaminophen 7.5-325 MG per  tablet   Commonly known as: PERCOCET   Take 1-2 tablets by mouth every 4 (four) hours as needed for pain.      promethazine  25 MG tablet   Commonly known as: PHENERGAN   Take 1 tablet (25 mg total) by mouth every 6 (six) hours as needed for nausea.      warfarin 5 MG tablet   Commonly known as: COUMADIN   Take 1 tablet (5 mg total) by mouth daily.           Follow-up Information    Call Loreta Ave, MD. (as directed)    Contact information:   [facility] [address] [place] [address] [phone]          Signed: Clarene Critchley 10/03/2011, 11:21 AM

## 2011-12-25 ENCOUNTER — Other Ambulatory Visit: Payer: Self-pay | Admitting: Orthopedic Surgery

## 2011-12-25 ENCOUNTER — Ambulatory Visit
Admission: RE | Admit: 2011-12-25 | Discharge: 2011-12-25 | Disposition: A | Discharge status: Home / Self Care | Payer: BC Managed Care – PPO | Source: Ambulatory Visit | Attending: Orthopedic Surgery | Admitting: Orthopedic Surgery

## 2011-12-25 DIAGNOSIS — M79673 Pain in unspecified foot: Secondary | ICD-10-CM

## 2012-03-09 ENCOUNTER — Encounter: Payer: Self-pay | Admitting: Women's Health

## 2012-03-09 ENCOUNTER — Other Ambulatory Visit: Payer: Self-pay | Admitting: Women's Health

## 2012-03-09 ENCOUNTER — Ambulatory Visit (INDEPENDENT_AMBULATORY_CARE_PROVIDER_SITE_OTHER): Payer: BC Managed Care – PPO | Admitting: Women's Health

## 2012-03-09 VITALS — BP 116/64 | Ht 63.5 in | Wt 201.0 lb

## 2012-03-09 DIAGNOSIS — E7800 Pure hypercholesterolemia, unspecified: Secondary | ICD-10-CM

## 2012-03-09 DIAGNOSIS — Z13220 Encounter for screening for lipoid disorders: Secondary | ICD-10-CM

## 2012-03-09 DIAGNOSIS — Z833 Family history of diabetes mellitus: Secondary | ICD-10-CM

## 2012-03-09 DIAGNOSIS — M25569 Pain in unspecified knee: Secondary | ICD-10-CM

## 2012-03-09 DIAGNOSIS — Z01419 Encounter for gynecological examination (general) (routine) without abnormal findings: Secondary | ICD-10-CM

## 2012-03-09 LAB — COMPREHENSIVE METABOLIC PANEL
ALT: 14 U/L (ref 0–35)
Alkaline Phosphatase: 62 U/L (ref 39–117)
CO2: 29 mEq/L (ref 19–32)
Creat: 0.83 mg/dL (ref 0.50–1.10)
Sodium: 141 mEq/L (ref 135–145)
Total Bilirubin: 0.5 mg/dL (ref 0.3–1.2)
Total Protein: 6.7 g/dL (ref 6.0–8.3)

## 2012-03-09 LAB — CBC WITH DIFFERENTIAL/PLATELET
Basophils Relative: 0 % (ref 0–1)
Eosinophils Absolute: 0.2 10*3/uL (ref 0.0–0.7)
Eosinophils Relative: 3 % (ref 0–5)
MCH: 29.6 pg (ref 26.0–34.0)
MCHC: 35.6 g/dL (ref 30.0–36.0)
MCV: 83.2 fL (ref 78.0–100.0)
Neutrophils Relative %: 55 % (ref 43–77)
Platelets: 278 10*3/uL (ref 150–400)
RDW: 13.6 % (ref 11.5–15.5)

## 2012-03-09 NOTE — Patient Instructions (Signed)

## 2012-03-09 NOTE — Progress Notes (Signed)
Jasmine Spencer 1965-10-11 [id number]    History:    The patient presents for annual exam.  Regular monthly cycles/lesbian. 2 pregnancies by donor insemination. History of normal Paps and mammograms. Hypercholesterolemia treated by Dr. Jillyn Spencer at [facility]. Right knee replacement August 2013.   Past medical history, past surgical history, family history and social history were all reviewed and documented in the EPIC chart. Jasmine Spencer 12, Jasmine Spencer 7. English Research officer, trade [employer]. Mother history of melanoma.   ROS:  A  ROS was performed and pertinent positives and negatives are included in the history.  Exam:  Filed Vitals:   03/09/12 1607  BP: 116/64    General appearance:  Normal Head/Neck:  Normal, without cervical or supraclavicular adenopathy. Thyroid:  Symmetrical, normal in size, without palpable masses or nodularity. Respiratory  Effort:  Normal  Auscultation:  Clear without wheezing or rhonchi Cardiovascular  Auscultation:  Regular rate, without rubs, murmurs or gallops  Edema/varicosities:  Not grossly evident Abdominal  Soft,nontender, without masses, guarding or rebound.  Liver/spleen:  No organomegaly noted  Hernia:  None appreciated  Skin  Inspection:  Grossly normal  Palpation:  Grossly normal Neurologic/psychiatric  Orientation:  Normal with appropriate conversation.  Mood/affect:  Normal  Genitourinary    Breasts: Examined lying and sitting.     Right: Without masses, retractions, discharge or axillary adenopathy.     Left: Without masses, retractions, discharge or axillary adenopathy.   Inguinal/mons:  Normal without inguinal adenopathy  External genitalia:  Normal  BUS/Urethra/Skene's glands:  Normal  Bladder:  Normal  Vagina:  Normal  Cervix:  Normal  Uterus:   normal in size, shape and contour.  Midline and mobile  Adnexa/parametria:     Rt: Without masses or tenderness.   Lt: Without masses or tenderness.  Anus and perineum: Normal  Digital  rectal exam: Normal sphincter tone without palpated masses or tenderness  Assessment/Plan:  47 y.o. S. WF G2 P2  for annual exam with no complaints.  Normal GYN exam Overweight Hypercholesteremia-fenofibrate /PC  Plan: SBE's, continue annual mammogram, calcium rich diet, vitamin D 1000 daily encouraged. Continue active lifestyle/coaching and exercise, decrease calories for weight loss. CBC, lipid panel, C MET, UA/will fax labs to primary care. Pap normal 2013. New screening guidelines reviewed. Instructed to have annual skin check and increase use of sunscreen's/mother history of melanoma.   Jasmine Spencer [facility], 4:43 PM 03/09/2012

## 2012-03-09 NOTE — Assessment & Plan Note (Signed)
Right knee replacement 09/2011

## 2012-03-10 LAB — URINALYSIS W MICROSCOPIC + REFLEX CULTURE
Bilirubin Urine: NEGATIVE
Casts: NONE SEEN
Glucose, UA: NEGATIVE mg/dL
Hgb urine dipstick: NEGATIVE
Leukocytes, UA: NEGATIVE
Protein, ur: NEGATIVE mg/dL
Squamous Epithelial / LPF: NONE SEEN
pH: 5.5 (ref 5.0–8.0)

## 2012-03-10 LAB — LIPID PANEL
LDL Cholesterol: 102 mg/dL — ABNORMAL HIGH (ref 0–99)
VLDL: 29 mg/dL (ref 0–40)

## 2012-03-11 ENCOUNTER — Encounter: Payer: Self-pay | Admitting: Women's Health

## 2012-12-29 ENCOUNTER — Other Ambulatory Visit: Payer: Self-pay

## 2013-11-08 ENCOUNTER — Ambulatory Visit (INDEPENDENT_AMBULATORY_CARE_PROVIDER_SITE_OTHER): Payer: BC Managed Care – PPO | Admitting: Emergency Medicine

## 2013-11-08 ENCOUNTER — Other Ambulatory Visit: Payer: Self-pay | Admitting: Women's Health

## 2013-11-08 VITALS — BP 104/68 | HR 73 | Temp 98.1°F | Resp 16 | Ht 63.5 in | Wt 193.2 lb

## 2013-11-08 DIAGNOSIS — Z1231 Encounter for screening mammogram for malignant neoplasm of breast: Secondary | ICD-10-CM

## 2013-11-08 DIAGNOSIS — J029 Acute pharyngitis, unspecified: Secondary | ICD-10-CM

## 2013-11-08 MED ORDER — AMOXICILLIN 500 MG PO CAPS: 500.0000 mg | ORAL_CAPSULE | Freq: Three times a day (TID) | ORAL | Status: DC

## 2013-11-08 MED ORDER — AMOXICILLIN-POT CLAVULANATE 875-125 MG PO TABS: 1.0000 | ORAL_TABLET | Freq: Two times a day (BID) | ORAL | Status: DC

## 2013-11-08 NOTE — Progress Notes (Signed)
[facility] [address], [place] [address] [phone]  Date:  11/08/2013   Name:  RMANI KAPUSTA   DOB:  15-Oct-1965   MRN:  [id number]  PCP:  Antony Blackbird, MD    Chief Complaint: Sore Throat and Fatigue   History of Present Illness:  Jasmine Spencer is a 48 y.o. very pleasant female patient who presents with the following:  Has a rather intense sore throat. Started saturday No congestion or drainage.  No PND No fever or chills.   No ill contacts No cough, wheezing or shortness of breath No improvement with over the counter medications or other home remedies. Denies other complaint or health concern today.   Patient Active Problem List   Diagnosis Date Noted  . Hypercholesteremia 03/09/2012  . Hyponatremia 10/03/2011  . Chronic knee pain 06/29/2011    Past Medical History  Diagnosis Date  . Elevated cholesterol   . Complication of anesthesia     Pt. has panicky feeling  ( feels like she can't breathe)upon awaking fr. anesth. , NEEDS TO BE SITTING UPRIGHT  . Family history of anesthesia complication     pt.'s father is very agitated /w MORPHINE, can tolerate DILAUDID  . H/O hiatal hernia     h/o - several yrs. ago, took Prilosec for 1 yr., no longer a problem   . Headache(784.0)     migraine- hormone related   . Arthritis     R knee    Past Surgical History  Procedure Laterality Date  . Cesarean section  2002, 2007  . Knee surgery  [phone]  . Shoulder surgery  1992  . Adenoidectomy      as a child  . Total knee arthroplasty  09/30/2011    Procedure: TOTAL KNEE ARTHROPLASTY;  Surgeon: Ninetta Lights, MD;  Location: [facility];  Service: Orthopedics;  Laterality: Right;    History  Substance Use Topics  . Smoking status: Never Smoker   . Smokeless tobacco: Never Used  . Alcohol Use: Yes     Comment: rare    Family History  Problem Relation Age of Onset  . Diabetes Mother   . Hypertension Mother   . Cancer Mother    MELANOMA  . Heart disease Father   . Leukemia Father     CLL  . Breast cancer Maternal Aunt     Allergies  Allergen Reactions  . Milk-Related Compounds Other (See Comments)    Lactose intolerance.  . Penicillins Other (See Comments)    Childhood allergy.  . Latex Rash    Medication list has been reviewed and updated.  Current Outpatient Prescriptions on File Prior to Visit  Medication Sig Dispense Refill  . aspirin-acetaminophen-caffeine (EXCEDRIN MIGRAINE) 250-250-65 MG per tablet Take 1 tablet by mouth every 6 (six) hours as needed. For migraine.       No current facility-administered medications on file prior to visit.    Review of Systems:  As per HPI, otherwise negative.    Physical Examination: Filed Vitals:   11/08/13 1913  BP: 104/68  Pulse: 73  Temp: 98.1 F (36.7 C)  Resp: 16   Filed Vitals:   11/08/13 1913  Height: 5' 3.5" (1.613 m)  Weight: 193 lb 3.2 oz (87.635 kg)   Body mass index is 33.68 kg/(m^2). Ideal Body Weight: Weight in (lb) to have BMI = 25: 143.1  GEN: WDWN, NAD, Non-toxic, A & O x 3 HEENT: Atraumatic, Normocephalic. Neck supple. No  masses, No LAD.  Oropharynx erythematous and injected. Ears and Nose: No external deformity. CV: RRR, No M/G/R. No JVD. No thrill. No extra heart sounds. PULM: CTA B, no wheezes, crackles, rhonchi. No retractions. No resp. distress. No accessory muscle use. ABD: S, NT, ND, +BS. No rebound. No HSM. EXTR: No c/c/e NEURO Normal gait.  PSYCH: Normally interactive. Conversant. Not depressed or anxious appearing.  Calm demeanor.    Assessment and Plan: Pharyngitis Amoxicillin  Signed,  Ellison Carwin, MD

## 2013-11-08 NOTE — Patient Instructions (Signed)

## 2013-11-27 ENCOUNTER — Ambulatory Visit (HOSPITAL_COMMUNITY)
Admission: RE | Admit: 2013-11-27 | Discharge: 2013-11-27 | Disposition: A | Discharge status: Home / Self Care | Payer: BC Managed Care – PPO | Source: Ambulatory Visit | Attending: Women's Health | Admitting: Women's Health

## 2013-11-27 DIAGNOSIS — Z1231 Encounter for screening mammogram for malignant neoplasm of breast: Secondary | ICD-10-CM | POA: Diagnosis present

## 2013-11-29 ENCOUNTER — Encounter: Payer: Self-pay | Admitting: Women's Health

## 2014-01-10 ENCOUNTER — Ambulatory Visit (INDEPENDENT_AMBULATORY_CARE_PROVIDER_SITE_OTHER): Payer: BC Managed Care – PPO | Admitting: Women's Health

## 2014-01-10 ENCOUNTER — Encounter: Payer: Self-pay | Admitting: Women's Health

## 2014-01-10 ENCOUNTER — Other Ambulatory Visit (HOSPITAL_COMMUNITY)
Admission: RE | Admit: 2014-01-10 | Discharge: 2014-01-10 | Disposition: A | Discharge status: Home / Self Care | Payer: BC Managed Care – PPO | Source: Ambulatory Visit | Attending: Gynecology | Admitting: Gynecology

## 2014-01-10 VITALS — BP 124/74 | Ht 63.0 in | Wt 195.0 lb

## 2014-01-10 DIAGNOSIS — Z01419 Encounter for gynecological examination (general) (routine) without abnormal findings: Secondary | ICD-10-CM | POA: Insufficient documentation

## 2014-01-10 DIAGNOSIS — Z13220 Encounter for screening for lipoid disorders: Secondary | ICD-10-CM

## 2014-01-10 DIAGNOSIS — Z833 Family history of diabetes mellitus: Secondary | ICD-10-CM

## 2014-01-10 DIAGNOSIS — N920 Excessive and frequent menstruation with regular cycle: Secondary | ICD-10-CM

## 2014-01-10 NOTE — Progress Notes (Signed)
Jasmine Spencer 04-07-1965 [id number]    History:    Presents for annual exam.  Regular monthly cycle for 7 days changing pads 3-4 times daily, over the past 6 months cycles have gotten heavier. Last month had to leave work twice,  bleeding through pads in less than one hour for 2 days of each cycle. Normal Pap and mammogram history. Lesbian/same partner. Hypercholesterolemia in the past primary care stopped medication.   Past medical history, past surgical history, family history and social history were all reviewed and documented in the EPIC chart. 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 Edison Nasuti 13, [name] 8 both doing well.  ROS:  A  12 point ROS was performed and pertinent positives and negatives are included.  Exam:  Filed Vitals:   01/10/14 1455  BP: 124/74    General appearance:  Normal Thyroid:  Symmetrical, normal in size, without palpable masses or nodularity. Respiratory  Auscultation:  Clear without wheezing or rhonchi Cardiovascular  Auscultation:  Regular rate, without rubs, murmurs or gallops  Edema/varicosities:  Not grossly evident Abdominal  Soft,nontender, without masses, guarding or rebound.  Liver/spleen:  No organomegaly noted  Hernia:  None appreciated  Skin  Inspection:  Grossly normal   Breasts: Examined lying and sitting.     Right: Without masses, retractions, discharge or axillary adenopathy.     Left: Without masses, retractions, discharge or axillary adenopathy. Gentitourinary   Inguinal/mons:  Normal without inguinal adenopathy  External genitalia:  Normal  BUS/Urethra/Skene's glands:  Normal  Vagina:  Normal  Cervix:  Normal  Uterus:   normal in size, shape and contour.  Midline and mobile  Adnexa/parametria:     Rt: Without masses or tenderness.   Lt: Without masses or tenderness.  Anus and perineum: Normal  Digital rectal exam: Normal sphincter tone without palpated masses or tenderness  Assessment/Plan:  48 y.o. SWF G2  P2 for annual exam.     Recent onset menorrhagia  Plan: CBC, TSH, UA, Pap. Pap normal 2012, new screening guidelines reviewed. SBE's, continue annual mammogram, calcium rich diet, iron rich foods encouraged. Continue regular exercise and active lifestyle, decrease calories for weight loss. Options reviewed for menorrhagia, sonohysterogram with Dr. Phineas Real, endometrial ablation information given and reviewed. (Reports mother was in her late 66s at menopause.)   Huel Cote [facility], 3:38 PM 01/10/2014

## 2014-01-10 NOTE — Patient Instructions (Signed)
Endometrial Ablation Endometrial ablation removes the lining of the uterus (endometrium). It is usually a same-day, outpatient treatment. Ablation helps avoid major surgery, such as surgery to remove the cervix and uterus (hysterectomy). After endometrial ablation, you will have little or no menstrual bleeding and may not be able to have children. However, if you are premenopausal, you will need to use a reliable method of birth control following the procedure because of the small chance that pregnancy can occur. There are different reasons to have this procedure, which include:  Heavy periods.  Bleeding that is causing anemia.  Irregular bleeding.  Bleeding fibroids on the lining inside the uterus if they are smaller than 3 centimeters. This procedure should not be done if:  You want children in the future.  You have severe cramps with your menstrual period.  You have precancerous or cancerous cells in your uterus.  You were recently pregnant.  You have gone through menopause.  You have had major surgery on the uterus, such as a cesarean delivery. LET YOUR HEALTH CARE PROVIDER KNOW ABOUT:  Any allergies you have.  All medicines you are taking, including vitamins, herbs, eye drops, creams, and over-the-counter medicines.  Previous problems you or members of your family have had with the use of anesthetics.  Any blood disorders you have.  Previous surgeries you have had.  Medical conditions you have. RISKS AND COMPLICATIONS  Generally, this is a safe procedure. However, as with any procedure, complications can occur. Possible complications include:  Perforation of the uterus.  Bleeding.  Infection of the uterus, bladder, or vagina.  Injury to surrounding organs.  An air bubble to the lung (air embolus).  Pregnancy following the procedure.  Failure of the procedure to help the problem, requiring hysterectomy.  Decreased ability to diagnose cancer in the lining of  the uterus. BEFORE THE PROCEDURE  The lining of the uterus must be tested to make sure there is no pre-cancerous or cancer cells present.  An ultrasound may be performed to look at the size of the uterus and to check for abnormalities.  Medicines may be given to thin the lining of the uterus. PROCEDURE  During the procedure, your health care provider will use a tool called a resectoscope to help see inside your uterus. There are different ways to remove the lining of your uterus.   Radiofrequency - This method uses a radiofrequency-alternating electric current to remove the lining of the uterus.  Cryotherapy - This method uses extreme cold to freeze the lining of the uterus.  Heated-Free Liquid - This method uses heated salt (saline) solution to remove the lining of the uterus.  Microwave - This method uses high-energy microwaves to heat up the lining of the uterus to remove it.  Thermal balloon - This method involves inserting a catheter with a balloon tip into the uterus. The balloon tip is filled with heated fluid to remove the lining of the uterus. AFTER THE PROCEDURE  After your procedure, do not have sexual intercourse or insert anything into your vagina until permitted by your health care provider. After the procedure, you may experience:  Cramps.  Vaginal discharge.  Frequent urination. Document Released: 12/20/2003 Document Revised: 10/12/2012 Document Reviewed: 07/13/2012 ExitCare Patient Information 2015 [employer]. This information is not intended to replace advice given to you by your health care provider. Make sure you discuss any questions you have with your health care provider.  

## 2014-01-11 LAB — URINALYSIS W MICROSCOPIC + REFLEX CULTURE
BILIRUBIN URINE: NEGATIVE
Bacteria, UA: NONE SEEN
Casts: NONE SEEN
Crystals: NONE SEEN
GLUCOSE, UA: NEGATIVE mg/dL
KETONES UR: NEGATIVE mg/dL
Leukocytes, UA: NEGATIVE
Nitrite: NEGATIVE
PROTEIN: NEGATIVE mg/dL
Specific Gravity, Urine: 1.017 (ref 1.005–1.030)
Squamous Epithelial / LPF: NONE SEEN
Urobilinogen, UA: 0.2 mg/dL (ref 0.0–1.0)
pH: 5 (ref 5.0–8.0)

## 2014-01-11 LAB — CBC WITH DIFFERENTIAL/PLATELET
BASOS ABS: 0 10*3/uL (ref 0.0–0.1)
BASOS PCT: 0 % (ref 0–1)
EOS PCT: 2 % (ref 0–5)
Eosinophils Absolute: 0.1 10*3/uL (ref 0.0–0.7)
HCT: 39.5 % (ref 36.0–46.0)
Hemoglobin: 13.3 g/dL (ref 12.0–15.0)
LYMPHS PCT: 34 % (ref 12–46)
Lymphs Abs: 2.4 10*3/uL (ref 0.7–4.0)
MCH: 29.3 pg (ref 26.0–34.0)
MCHC: 33.7 g/dL (ref 30.0–36.0)
MCV: 87 fL (ref 78.0–100.0)
MPV: 9.8 fL (ref 9.4–12.4)
Monocytes Absolute: 0.5 10*3/uL (ref 0.1–1.0)
Monocytes Relative: 7 % (ref 3–12)
NEUTROS ABS: 4 10*3/uL (ref 1.7–7.7)
Neutrophils Relative %: 57 % (ref 43–77)
PLATELETS: 248 10*3/uL (ref 150–400)
RBC: 4.54 MIL/uL (ref 3.87–5.11)
RDW: 12.9 % (ref 11.5–15.5)
WBC: 7.1 10*3/uL (ref 4.0–10.5)

## 2014-01-11 LAB — TSH: TSH: 0.577 u[IU]/mL (ref 0.350–4.500)

## 2014-01-12 LAB — CYTOLOGY - PAP

## 2014-01-16 ENCOUNTER — Other Ambulatory Visit: Payer: Self-pay | Admitting: Gynecology

## 2014-01-16 DIAGNOSIS — N920 Excessive and frequent menstruation with regular cycle: Secondary | ICD-10-CM

## 2014-01-25 ENCOUNTER — Encounter: Payer: Self-pay | Admitting: Gynecology

## 2014-01-25 ENCOUNTER — Ambulatory Visit (INDEPENDENT_AMBULATORY_CARE_PROVIDER_SITE_OTHER): Payer: BC Managed Care – PPO | Admitting: Gynecology

## 2014-01-25 ENCOUNTER — Ambulatory Visit (INDEPENDENT_AMBULATORY_CARE_PROVIDER_SITE_OTHER): Payer: BC Managed Care – PPO

## 2014-01-25 ENCOUNTER — Other Ambulatory Visit: Payer: Self-pay | Admitting: Gynecology

## 2014-01-25 VITALS — BP 120/74

## 2014-01-25 DIAGNOSIS — N920 Excessive and frequent menstruation with regular cycle: Secondary | ICD-10-CM

## 2014-01-25 DIAGNOSIS — D251 Intramural leiomyoma of uterus: Secondary | ICD-10-CM

## 2014-01-25 DIAGNOSIS — N852 Hypertrophy of uterus: Secondary | ICD-10-CM

## 2014-01-25 NOTE — Patient Instructions (Signed)
Office will call you to arrange the endometrial ablation.

## 2014-01-25 NOTE — Progress Notes (Signed)
Jasmine Spencer 1965/11/05 [id number]        48 y.o.  [id number] Presents for [place] for her regular annual exam complaining of heavy regular menses. Last 7 days with clots and bleedthrough episodes. CBC and TSH were normal. Jasmine Spencer had recommended a sonohysterogram.  Past medical history,surgical history, problem list, medications, allergies, family history and social history were all reviewed and documented in the EPIC chart.  Directed ROS with pertinent positives and negatives documented in the history of present illness/assessment and plan.  Exam: Jasmine Spencer assistant General appearance:  Normal Abdomen soft nontender without masses guarding rebound Pelvic external BUS vagina with narrow introitus, otherwise normal. Cervix normal. Uterus grossly normal to bimanual but difficult to palpate. Adnexa without masses or tenderness.  Ultrasound both transabdominal and transvaginal shows generous size uterus with leiomyoma measuring 36 mm. Individual echo 3.4 mm. Right left ovaries normal. Cul-de-sac negative.  Sonohysterogram performed, sterile technique, easy catheter introduction, good distention with no abnormalities. Endometrial sample taken. Patient tolerated well.  Assessment/Plan:  48 y.o. [id number] with menorrhagia, bleedthrough episodes clots lasting a week. Options for management including hormonal manipulation such as low-dose oral contraceptives noting that she does not smoke it is not being followed for any medical issues, progesterone only, Mirena IUD, endometrial ablation, hysterectomy. The benefits of each choice were reviewed. Currently not sexually active. Understands she should never attempt pregnancy following endometrial ablation and this is not an issue as she does not plan to and is not at risk for pregnancy at this time. Was involved with the procedure was discussed and the patient is leaning towards scheduling this and wants to move towards doing so. She will think  of her other options and if she has further questions she'll follow up with Jasmine Spencer was will present for a preoperative consult before the procedure. Patient will follow up for the biopsy results.     Anastasio Auerbach MD, 4:56 PM 01/25/2014

## 2014-01-30 ENCOUNTER — Telehealth: Payer: Self-pay

## 2014-01-30 MED ORDER — MEGESTROL ACETATE 20 MG PO TABS: ORAL_TABLET | ORAL | Status: DC

## 2014-01-30 NOTE — Telephone Encounter (Signed)
I called patient to discuss scheduling Her Option ablation.  She anticipates next period 02/17/14.  She prefers a Monday and she is off Monday 03/12/14 for holiday and would like to schedule then.  We discussed that this is Day 24 of cycle and her cycle is usually 28 days.  Will be very close if she were to start earlier it may not work.  She will plan on starting Megace 20 mg. on Day 3 of cycle and Rx was placed.  I did advise patient she will need someone to drive her to and from appointment. I caught her right before an appointment so she did not have time to further discuss. I told her I will call her again to review instructions, schedule consult, etc.

## 2014-01-31 NOTE — Telephone Encounter (Signed)
okay

## 2014-02-27 ENCOUNTER — Telehealth: Payer: Self-pay

## 2014-02-27 NOTE — Telephone Encounter (Signed)
Patient called today to tell me she needed to cancel the time I was holding for her for Jan 18 for Her Option Ablation. She thinks she would like to reschedule it to Feb.  I cancelled the 18th Jan and left her a message to call me to reschedule either with Feb menses or if she is regular enough to calculate ahead we can schedule it now. I will wait to hear from her.

## 2014-03-02 ENCOUNTER — Other Ambulatory Visit: Payer: Self-pay | Admitting: Gynecology

## 2014-03-02 ENCOUNTER — Telehealth: Payer: Self-pay

## 2014-03-02 DIAGNOSIS — N920 Excessive and frequent menstruation with regular cycle: Secondary | ICD-10-CM

## 2014-03-02 NOTE — Telephone Encounter (Signed)
Patient wants to schedule Her Option Ablation for Feb. She expects her menses to start 03/23/14-03/24/14.  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  I scheduled her for 04/04/14.  She has the Megace on hand already since she rescheduled last month.  She knows to start it on Day 3 of menses.  I spoke with her and confirmed date.  She was in her classroom and did not have time to schedule pre op and post op appts and review instructions (to include Cytotec)  so I will be calling her after 4:30pm one afternoon when she is home to take care of those details.

## 2014-03-19 ENCOUNTER — Ambulatory Visit: Payer: BC Managed Care – PPO | Admitting: Cardiovascular Disease

## 2014-03-20 MED ORDER — MISOPROSTOL 200 MCG PO TABS: ORAL_TABLET | ORAL | Status: DC

## 2014-03-20 NOTE — Telephone Encounter (Signed)
I spoke with patient today and we got her pre op and post op appts scheduled. I instructed her regarding full bladder day of surgery and will mail her those instructions as well.  Her menses began 03/19/14 and she has her Megace and will start it tomorrow.  We discussed she will need someone to drive her to and from the office on day of surgery. We discussed need for Cytotec tab vaginally hs before surgery and that Rx was sent. She will call me if any questions.

## 2014-03-27 ENCOUNTER — Encounter: Payer: Self-pay | Admitting: Gynecology

## 2014-03-27 ENCOUNTER — Ambulatory Visit (INDEPENDENT_AMBULATORY_CARE_PROVIDER_SITE_OTHER): Payer: BC Managed Care – PPO | Admitting: Gynecology

## 2014-03-27 VITALS — BP 116/74

## 2014-03-27 DIAGNOSIS — N920 Excessive and frequent menstruation with regular cycle: Secondary | ICD-10-CM

## 2014-03-27 MED ORDER — AZITHROMYCIN 500 MG PO TABS: 500.0000 mg | ORAL_TABLET | Freq: Every day | ORAL | Status: DC

## 2014-03-27 MED ORDER — DIAZEPAM 5 MG PO TABS: 5.0000 mg | ORAL_TABLET | Freq: Four times a day (QID) | ORAL | Status: DC | PRN

## 2014-03-27 MED ORDER — HYDROCODONE-ACETAMINOPHEN 5-325 MG PO TABS: 1.0000 | ORAL_TABLET | Freq: Four times a day (QID) | ORAL | Status: DC | PRN

## 2014-03-27 NOTE — Progress Notes (Signed)
Jasmine Spencer 1965-04-25 [id number]        49 y.o.  [id number] presents preoperatively for upcoming HerOption endometrial ablation.  Past medical history,surgical history, problem list, medications, allergies, family history and social history were all reviewed and documented in the EPIC chart.  Directed ROS with pertinent positives and negatives documented in the history of present illness/assessment and plan.  Exam: Kim assistant General appearance:  Normal Abdomen soft nontender without masses guarding rebound Pelvic external BUS vagina normal. Cervix normal. Uterus normal size midline mobile nontender. Adnexa without masses or tenderness.  Assessment/Plan:  49 y.o. [id number] with history of regular monthly menses bleeding 7 days with clots and bleedthrough episodes. CBC and TSH were normal. 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 Endometrial echo of 3.4 mm.  Endometrial biopsy negative.  Patient counseled for options to include hormonal manipulation, Mirena IUD, endometrial ablation, hysterectomy. Patient wants to proceed with endometrial ablation. She understands that she should never pursue pregnancy following this procedure which she is comfortable with as she never plans to be pregnant again which would require insemination. She also understands her no guarantees for success and that her periods may continue heavy or become irregular. I reviewed the proposed surgery with her to include the expected intraoperative and postoperative courses as well as the recovery period. Risks of infection, bleeding requiring transfusion, damage to the uterus such as perforation as well as damage to surrounding tissues such as bowel bladder ureters vessels or nerves either directly or through cryo-damage reviewed requiring future reparative surgeries. The list of medications were also reviewed and I discussed how to take them and timing. I provided a prescription for each.  She will take the antibiotic before  hand as is recommended by her orthopedic surgeon due to her knee replacement. We will also plan a postoperative dose.  Patient has read through the consent form and signed it and it will be scanned into Epic. The patient's questions were answered to her satisfaction and she will follow up for the scheduled procedure. She will call before and if she has any questions.     Anastasio Auerbach MD, 4:56 PM 03/27/2014

## 2014-03-27 NOTE — Patient Instructions (Signed)
Follow up for endometrial ablation as scheduled

## 2014-03-30 ENCOUNTER — Encounter: Payer: Self-pay | Admitting: Cardiology

## 2014-03-30 ENCOUNTER — Ambulatory Visit (INDEPENDENT_AMBULATORY_CARE_PROVIDER_SITE_OTHER): Payer: BC Managed Care – PPO | Admitting: Cardiology

## 2014-03-30 VITALS — BP 120/84 | HR 70 | Ht 63.0 in | Wt 199.0 lb

## 2014-03-30 DIAGNOSIS — R0602 Shortness of breath: Secondary | ICD-10-CM

## 2014-03-30 DIAGNOSIS — R002 Palpitations: Secondary | ICD-10-CM | POA: Insufficient documentation

## 2014-03-30 DIAGNOSIS — Z8249 Family history of ischemic heart disease and other diseases of the circulatory system: Secondary | ICD-10-CM

## 2014-03-30 NOTE — Progress Notes (Signed)
Cardiology Office Note   Date:  03/30/2014   ID:  Jasmine Spencer, DOB Feb 20, 1966, MRN [id number]  PCP:  No primary care provider on file.  Cardiologist:   Sueanne Margarita, MD   Chief Complaint  Patient presents with  . Palpitations      History of Present Illness: Jasmine Spencer is a 49 y.o. female who presents for evaluation of chest pain and palpitations.  She says that in December she had experienced some "thuds" in her chest.  There was a 2 week period where it occurred daily but it seems to have settled down and over the past few weeks it probably has only occurred about 2 weeks.  She has also noticed some fluttering in her chest when she is walking that is sporadic.  She had a chest cold a few weeks ago and her chest felt tight but that has resolved but still has a mild cough.  She does feel lightheaded when she gets the palpitations.  She has noticed some DOE but says that she is out of shape and has gained some weight.  She has chronic LE edema.      Past Medical History  Diagnosis Date  . Elevated cholesterol   . Complication of anesthesia     Pt. has panicky feeling  ( feels like she can't breathe)upon awaking fr. anesth. , NEEDS TO BE SITTING UPRIGHT  . Family history of anesthesia complication     pt.'s father is very agitated /w MORPHINE, can tolerate DILAUDID  . H/O hiatal hernia     h/o - several yrs. ago, took Prilosec for 1 yr., no longer a problem   . Headache(784.0)     migraine- hormone related   . Arthritis     R knee    Past Surgical History  Procedure Laterality Date  . Cesarean section  2002, 2007  . Knee surgery  [phone]  . Shoulder surgery  1992  . Adenoidectomy      as a child  . Total knee arthroplasty  09/30/2011    Procedure: TOTAL KNEE ARTHROPLASTY;  Surgeon: Ninetta Lights, MD;  Location: [facility];  Service: Orthopedics;  Laterality: Right;     Current Outpatient Prescriptions  Medication Sig Dispense Refill  .  aspirin-acetaminophen-caffeine (EXCEDRIN MIGRAINE) 250-250-65 MG per tablet Take 1 tablet by mouth every 6 (six) hours as needed. For migraine.    Marland Kitchen azithromycin (ZITHROMAX) 500 MG tablet Take 1 tablet (500 mg total) by mouth daily. Start day of procedure 2 tablet 0  . diazepam (VALIUM) 5 MG tablet Take 1 tablet (5 mg total) by mouth every 6 (six) hours as needed for anxiety. 15 tablet 0  . HYDROcodone-acetaminophen (LORTAB) 5-325 MG per tablet Take 1 tablet by mouth every 6 (six) hours as needed for moderate pain. 20 tablet 0  . megestrol (MEGACE) 20 MG tablet One daily beginning on Day 3 of menses until surgery. 25 tablet 0  . misoprostol (CYTOTEC) 200 MCG tablet Insert tab VAGINALLY hs before surgery. 1 tablet 0   No current facility-administered medications for this visit.    Allergies:   Milk-related compounds; Penicillins; and Latex    Social History:  The patient  reports that she has never smoked. She has never used smokeless tobacco. She reports that she drinks alcohol. She reports that she does not use illicit drugs.   Family History:  The patient's family history includes Breast cancer in her maternal aunt; Cancer in her mother; Diabetes  in her mother; Heart attack in her brother, father, and sister; Heart disease in her brother, father, and sister; Hypertension in her mother; Leukemia in her father.    ROS:  Please see the history of present illness.   Otherwise, review of systems are positive for none.   All other systems are reviewed and negative.    PHYSICAL EXAM: VS:  BP 120/84 mmHg  Pulse 70  Ht 5\' 3"  (1.6 m)  Wt 199 lb (90.266 kg)  BMI 35.26 kg/m2  LMP 03/19/2014 , BMI Body mass index is 35.26 kg/(m^2). GEN: Well nourished, well developed, in no acute distress HEENT: normal Neck: no JVD, carotid bruits, or masses Cardiac: RRR; no murmurs, rubs, or gallops,no edema  Respiratory:  clear to auscultation bilaterally, normal work of breathing GI: soft, nontender,  nondistended, + BS MS: no deformity or atrophy Skin: warm and dry, no rash Neuro:  Strength and sensation are intact Psych: euthymic mood, full affect   EKG:  EKG is ordered today. The ekg ordered today demonstrates NSR at 70bpm with no ST changes   Recent Labs: 01/10/2014: Hemoglobin 13.3; Platelets 248; TSH 0.577    Lipid Panel    Component Value Date/Time   CHOL 181 03/09/2012 1645   TRIG 144 03/09/2012 1645   HDL 50 03/09/2012 1645   CHOLHDL 3.6 03/09/2012 1645   VLDL 29 03/09/2012 1645   LDLCALC 102* 03/09/2012 1645      Wt Readings from Last 3 Encounters:  03/30/14 199 lb (90.266 kg)  01/10/14 195 lb (88.451 kg)  11/08/13 193 lb 3.2 oz (87.635 kg)       ASSESSMENT AND PLAN:  1.  Atypical chest pain in setting of URI that has resovled. 2.  DOE which she attributes to recent weight gain and sedentary state.  She does have CRF including strong family history of early CAD (sister and brother but they were heavy smokers) and dyslipidemia.  I will get a 2D echo to assess LVF and stress myoview to rule out ischemia.   3. Palpitations - I will get ane event monitor to assess.  She does have an aunt that had SCD.    Current medicines are reviewed at length with the patient today.  The patient does not have concerns regarding medicines.  The following changes have been made:  no change  Labs/ tests ordered today include: 2D echo, Stress myoview  No orders of the defined types were placed in this encounter.     Disposition:   FU with PRN pending results of studies   SignedSueanne Margarita, MD  03/30/2014 4:03 PM    [facility] [address], [place], [place]  [address] Phone: [phone]; Fax: [phone]

## 2014-03-30 NOTE — Patient Instructions (Addendum)
Your physician has requested that you have an echocardiogram. Echocardiography is a painless test that uses sound waves to create images of your heart. It provides your doctor with information about the size and shape of your heart and how well your heart's chambers and valves are working. This procedure takes approximately one hour. There are no restrictions for this procedure.  Your physician has requested that you have an exercise tolerance test. For further information please visit [email or web address]. Please also follow instruction sheet, as given.  Your physician has recommended that you wear an event monitor. Event monitors are medical devices that record the heart's electrical activity. Doctors most often [place] these monitors to diagnose arrhythmias. Arrhythmias are problems with the speed or rhythm of the heartbeat. The monitor is a small, portable device. You can wear one while you do your normal daily activities. This is usually used to diagnose what is causing palpitations/syncope (passing out).  Your physician recommends that you schedule a follow-up appointment AS NEEDED with Dr. Radford Pax pending your tests.

## 2014-04-04 ENCOUNTER — Encounter: Payer: Self-pay | Admitting: Gynecology

## 2014-04-04 ENCOUNTER — Ambulatory Visit (INDEPENDENT_AMBULATORY_CARE_PROVIDER_SITE_OTHER): Payer: BC Managed Care – PPO | Admitting: Gynecology

## 2014-04-04 ENCOUNTER — Ambulatory Visit (INDEPENDENT_AMBULATORY_CARE_PROVIDER_SITE_OTHER): Payer: BC Managed Care – PPO

## 2014-04-04 VITALS — BP 120/74 | HR 76

## 2014-04-04 DIAGNOSIS — R102 Pelvic and perineal pain: Secondary | ICD-10-CM

## 2014-04-04 DIAGNOSIS — N920 Excessive and frequent menstruation with regular cycle: Secondary | ICD-10-CM

## 2014-04-04 HISTORY — PX: ENDOMETRIAL ABLATION: SHX621

## 2014-04-04 MED ORDER — LIDOCAINE HCL 1 % IJ SOLN
10.0000 mL | Freq: Once | INTRAMUSCULAR | Status: AC
  Administered 2014-04-04: 10 mL

## 2014-04-04 MED ORDER — KETOROLAC TROMETHAMINE 30 MG/ML IJ SOLN
60.0000 mg | Freq: Once | INTRAMUSCULAR | Status: AC
  Administered 2014-04-04: 60 mg via INTRAMUSCULAR

## 2014-04-04 NOTE — Patient Instructions (Signed)
Follow up in 2 weeks at your scheduled postoperative appointment

## 2014-04-04 NOTE — Progress Notes (Signed)
HER OPTION ENDOMETRIAL ABLATION PROCEDURAL NOTE    Nava Song [facility] 1965/10/14 [id number]   04/04/2014  Diagnosis:  Excessive uterine bleeding / Menorrhagia  Procedure:  Endometrial cryoablation with intraoperative ultrasonic guidance  Procedure:  The patient was brought to the treatment room having previously been counseled for the procedure and having signed the consent form that is scanned into Epic. Pre procedural medications received were Valium 5 mg, Lortab 5.0/325 one tablet, Toradol 60 mg IM.  The patient was placed in the dorsal lithotomy position and a speculum was inserted. The cervix and upper vagina were cleansed with Betadine. A single-tooth tenaculum was placed on the anterior lip of the cervix. A  paracervical block was placed Yes.   using 10 cc's of 1% lidocaine.  The uterus was Yes.   sounded 10 centimeters. Cervical dilatation performed No. . Under ultrasound guidance, the Her Option probe was introduced into the uterine cavity after the preprocedural sequence was performed. After assuring proper cornual placement, cryoablation was then performed under continuous ultrasound guidance monitoring the growth of the cryo-zone. Sequential cryoablation's were performed in the following order:   Location   Length of Time Myometrial Depth 1. Right cornua   6 minutes  16 mm sagittal, 14 mm transverse 2. Left cornua   6 minutes  13.9 mm sagittal, 14 mm    Upon completion of the procedure the instruments were removed, hemostasis visualized the patient was assisted to the bathroom and then to another exam room where she was observed. The patient tolerated the procedure well and was released in stable condition with her driver along with a copy of the postprocedural instructions and precautions which were reviewed with her. She is to return to the office in 2 weeks for post procedural check and received an appointment date and time.    Anastasio Auerbach MD, 10:32 AM  04/04/2014

## 2014-04-05 ENCOUNTER — Encounter: Payer: Self-pay | Admitting: *Deleted

## 2014-04-05 ENCOUNTER — Encounter (INDEPENDENT_AMBULATORY_CARE_PROVIDER_SITE_OTHER): Payer: BC Managed Care – PPO

## 2014-04-05 ENCOUNTER — Ambulatory Visit (HOSPITAL_COMMUNITY): Payer: BC Managed Care – PPO | Attending: Cardiology | Admitting: Radiology

## 2014-04-05 DIAGNOSIS — R0602 Shortness of breath: Secondary | ICD-10-CM | POA: Diagnosis not present

## 2014-04-05 DIAGNOSIS — R002 Palpitations: Secondary | ICD-10-CM

## 2014-04-05 NOTE — Progress Notes (Signed)
Echocardiogram performed.  

## 2014-04-05 NOTE — Progress Notes (Signed)
Patient ID: Jasmine Spencer, female   DOB: 06-Feb-1966, 49 y.o.   MRN: [id number] [employer] 30 day cardiac event monitor applied to patient.

## 2014-04-09 ENCOUNTER — Telehealth: Payer: Self-pay | Admitting: *Deleted

## 2014-04-09 NOTE — Telephone Encounter (Signed)
pt notified about echo results with verbal understanding to the results given today.

## 2014-04-18 ENCOUNTER — Encounter: Payer: Self-pay | Admitting: Gynecology

## 2014-04-18 ENCOUNTER — Ambulatory Visit (INDEPENDENT_AMBULATORY_CARE_PROVIDER_SITE_OTHER): Payer: BC Managed Care – PPO | Admitting: Gynecology

## 2014-04-18 VITALS — BP 120/76

## 2014-04-18 DIAGNOSIS — Z09 Encounter for follow-up examination after completed treatment for conditions other than malignant neoplasm: Secondary | ICD-10-CM

## 2014-04-18 NOTE — Progress Notes (Signed)
Jasmine Spencer 1966/01/09 [id number]        49 y.o.  [id number] Presents for postoperative checkup status post HerOption endometrial ablation 04/04/2014. Has done well with 1 episode of bleeding afterwards.  Past medical history,surgical history, problem list, medications, allergies, family history and social history were all reviewed and documented in the EPIC chart.  Directed ROS with pertinent positives and negatives documented in the history of present illness/assessment and plan.  Exam: Kim assistant General appearance:  Normal Abdomen soft nontender without masses guarding rebound Pelvic external BUS vagina normal. Cervix normal. Uterus normal size midline mobile nontender. Adnexa without masses or tenderness  Assessment/Plan:  49 y.o. [id number] with normal postoperative check up status post HerOption endometrial ablation. Keep menstrual calendar for now.  Follow up this coming fall when due for annual exam, sooner if any issues.     Anastasio Auerbach MD, 3:05 PM 04/18/2014

## 2014-04-18 NOTE — Patient Instructions (Addendum)
Follow up for your annual exam this coming fall, sooner if any issues.

## 2014-04-23 ENCOUNTER — Telehealth: Payer: Self-pay | Admitting: Cardiovascular Disease

## 2014-04-23 ENCOUNTER — Ambulatory Visit (INDEPENDENT_AMBULATORY_CARE_PROVIDER_SITE_OTHER): Payer: BC Managed Care – PPO | Admitting: Physician Assistant

## 2014-04-23 DIAGNOSIS — R0602 Shortness of breath: Secondary | ICD-10-CM

## 2014-04-23 DIAGNOSIS — R002 Palpitations: Secondary | ICD-10-CM

## 2014-04-23 NOTE — Progress Notes (Signed)
Exercise Treadmill Test  Pre-Exercise Testing Evaluation Rhythm: normal sinus  Rate: 75     Test  Exercise Tolerance Test Ordering MD: Fransico Him, MD  Interpreting MD: Ermalinda Barrios, PA-C  Unique Test No: 1  Treadmill:  1  Indication for ETT: SOB  Contraindication to ETT: No   Stress Modality: exercise - treadmill  Cardiac Imaging Performed: non   Protocol: standard Bruce - maximal  Max BP:  165/77  Max MPHR (bpm):  173 85% MPR (bpm):  147  MPHR obtained (bpm): 160 % MPHR obtained: 92  Reached 85% MPHR (min:sec):  9:08 Total Exercise Time (min-sec):  9:48  Workload in METS:  11.3 Borg Scale: 15  Reason ETT Terminated:  fatigue    ST Segment Analysis At Rest: normal ST segments - no evidence of significant ST depression With Exercise: no evidence of significant ST depression  Other Information Arrhythmia:  No one PVC Angina during ETT:  absent (0) Quality of ETT:  diagnostic  ETT Interpretation:  normal - no evidence of ischemia by ST analysis  Comments: Good exercise tolerance. No chest pain. Stopped due to fatigue. Upsloping ST changes.  Recommendations: F/U Dr. Radford Pax.

## 2014-04-23 NOTE — Telephone Encounter (Signed)
ERROR

## 2014-05-09 ENCOUNTER — Telehealth: Payer: Self-pay | Admitting: Cardiology

## 2014-05-09 NOTE — Telephone Encounter (Signed)
Left message to call back  

## 2014-05-09 NOTE — Telephone Encounter (Signed)
Please let patient know that heart monitor was fine

## 2014-05-10 NOTE — Telephone Encounter (Signed)
Informed patient of results and verbal understanding expressed.  

## 2014-05-16 ENCOUNTER — Telehealth: Payer: Self-pay | Admitting: *Deleted

## 2014-05-16 NOTE — Telephone Encounter (Signed)
Pt called c/o vaginal discharge , I advised pt to make OV. Transferred to front desk

## 2014-05-23 ENCOUNTER — Encounter: Payer: Self-pay | Admitting: Gynecology

## 2014-05-23 ENCOUNTER — Ambulatory Visit (INDEPENDENT_AMBULATORY_CARE_PROVIDER_SITE_OTHER): Payer: BC Managed Care – PPO | Admitting: Gynecology

## 2014-05-23 VITALS — BP 120/76

## 2014-05-23 DIAGNOSIS — N898 Other specified noninflammatory disorders of vagina: Secondary | ICD-10-CM | POA: Diagnosis not present

## 2014-05-23 DIAGNOSIS — N920 Excessive and frequent menstruation with regular cycle: Secondary | ICD-10-CM | POA: Diagnosis not present

## 2014-05-23 LAB — WET PREP FOR TRICH, YEAST, CLUE
Clue Cells Wet Prep HPF POC: NONE SEEN
TRICH WET PREP: NONE SEEN
WBC, Wet Prep HPF POC: NONE SEEN
Yeast Wet Prep HPF POC: NONE SEEN

## 2014-05-23 MED ORDER — METRONIDAZOLE 500 MG PO TABS: 500.0000 mg | ORAL_TABLET | Freq: Two times a day (BID) | ORAL | Status: DC

## 2014-05-23 NOTE — Patient Instructions (Signed)
Take the oral antibiotic twice daily for 7 days. Follow up if your symptoms persist, worsen or recur.  Call me if her next period is very heavy and we will start birth control pills as we discussed.

## 2014-05-23 NOTE — Progress Notes (Signed)
Jasmine Spencer [birth year] [id number]        49 y.o.  [id number] presents with 2 issues. The first is one week history of heavy clear vaginal discharge. No significant irritation or itching. No odor. No urinary symptoms such as frequency dysuria or urgency. Second issue is she has had 2 menses since her endometrial ablation and her last menses was very heavy as it was before the ablation.  Past medical history,surgical history, problem list, medications, allergies, family history and social history were all reviewed and documented in the EPIC chart.  Directed ROS with pertinent positives and negatives documented in the history of present illness/assessment and plan.  Exam: Kim assistant Filed Vitals:   05/23/14 0953  BP: 120/76   General appearance:  Normal Abdomen soft nontender without masses guarding rebound Pelvic external BUS vagina with slight white discharge. Cervix normal. Uterus grossly normal midline mobile nontender. Adnexa without masses or tenderness.  Assessment/Plan:  49 y.o. [id number] with:  1. Vaginal discharge. Wet prep is unremarkable. We'll treat as a low-grade bacterial vaginosis with Flagyl 500 mg twice a day 7 days, alcohol [place] reviewed. Follow up if symptoms persist, worsen or recur. 2. Persistent menorrhagia early on after endometrial ablation.  Patient will monitor over the next 1 or 2 cycles. If heavy menses continue options for management to include hormonal manipulation such as low-dose oral contraceptives, attempted Mirena IUD up to including hysterectomy. The issues with each approach discussed. Patient's interested in starting low-dose oral contraceptives if menorrhagia continues. We've discussed the risks to include thrombosis such as stroke heart attack DVT. She never smoked and is not being followed for diabetes or hypertension. Does have a history of menstrual related migraines. Possibilities to improve or worsen on oral contraceptives. Increased risk of  stroke. Will call if her menses continue heavy and will rediscuss options.    Anastasio Auerbach MD, 10:08 AM 05/23/2014

## 2014-07-13 ENCOUNTER — Telehealth: Payer: Self-pay | Admitting: *Deleted

## 2014-07-13 MED ORDER — NORETHINDRONE ACET-ETHINYL EST 1-20 MG-MCG PO TABS: ORAL_TABLET | ORAL | Status: DC

## 2014-07-13 NOTE — Telephone Encounter (Signed)
okay

## 2014-07-13 NOTE — Telephone Encounter (Signed)
Okay for Loestrin 1/20 equivalent refill through her annual exam this coming fall

## 2014-07-13 NOTE — Telephone Encounter (Signed)
Pt had ablation on 04/04/14, Persistent menorrhagia early on after endometrial ablation. Patient will monitor over the next 1 or 2 cycles. Pt said bleeding is not any better, told to call and birth control pills will be sent. Please advise

## 2014-07-13 NOTE — Telephone Encounter (Signed)
Rx sent 

## 2014-07-13 NOTE — Telephone Encounter (Signed)
Pt said you told her she could take pills so cycle would come every 3 months, okay for Rx to be sent this way?

## 2014-07-30 ENCOUNTER — Telehealth: Payer: Self-pay

## 2014-07-30 NOTE — Telephone Encounter (Signed)
Patient informed. Reassured. She will try to give it two cycles and will let us know if persists.

## 2014-07-30 NOTE — Telephone Encounter (Signed)
I would complete out the first pack. Even if she starts to spot oblique keep taking the birth control pills. It can take 1 or 2 cycles sometimes to override the system.

## 2014-07-30 NOTE — Telephone Encounter (Signed)
Patient is on her first pack of birth control pills and is on the 2nd pill of the 3rd week and has begun to spot. She said her symptoms feel like she is getting ready to start like a regular period.  She said she is so tired of this and asked if she needs stronger bcp?

## 2014-09-20 ENCOUNTER — Other Ambulatory Visit: Payer: Self-pay | Admitting: Gynecology

## 2014-10-25 ENCOUNTER — Encounter: Payer: Self-pay | Admitting: Women's Health

## 2014-10-25 ENCOUNTER — Ambulatory Visit (INDEPENDENT_AMBULATORY_CARE_PROVIDER_SITE_OTHER): Payer: BC Managed Care – PPO | Admitting: Women's Health

## 2014-10-25 VITALS — BP 118/80 | Ht 63.0 in | Wt 203.0 lb

## 2014-10-25 DIAGNOSIS — R5383 Other fatigue: Secondary | ICD-10-CM

## 2014-10-25 LAB — CBC WITH DIFFERENTIAL/PLATELET
Basophils Absolute: 0 10*3/uL (ref 0.0–0.1)
Basophils Relative: 0 % (ref 0–1)
EOS PCT: 4 % (ref 0–5)
Eosinophils Absolute: 0.3 10*3/uL (ref 0.0–0.7)
HEMATOCRIT: 39.5 % (ref 36.0–46.0)
Hemoglobin: 13.5 g/dL (ref 12.0–15.0)
LYMPHS PCT: 36 % (ref 12–46)
Lymphs Abs: 2.6 10*3/uL (ref 0.7–4.0)
MCH: 29.5 pg (ref 26.0–34.0)
MCHC: 34.2 g/dL (ref 30.0–36.0)
MCV: 86.2 fL (ref 78.0–100.0)
MONO ABS: 0.6 10*3/uL (ref 0.1–1.0)
MPV: 9.2 fL (ref 8.6–12.4)
Monocytes Relative: 8 % (ref 3–12)
Neutro Abs: 3.7 10*3/uL (ref 1.7–7.7)
Neutrophils Relative %: 52 % (ref 43–77)
Platelets: 302 10*3/uL (ref 150–400)
RBC: 4.58 MIL/uL (ref 3.87–5.11)
RDW: 13.1 % (ref 11.5–15.5)
WBC: 7.1 10*3/uL (ref 4.0–10.5)

## 2014-10-25 LAB — TSH: TSH: 0.766 u[IU]/mL (ref 0.350–4.500)

## 2014-10-25 NOTE — Progress Notes (Signed)
Patient ID: Jasmine Spencer, female   DOB: 1965-11-06, 49 y.o.   MRN: [id number] Presents with complaint of overwhelming fatigue, intermittent nausea and generally not feeling well since starting on OCs. Had an endometrial ablation 03/2014. Continue to have heavy periods after, bleeding through clothes, changing pad every hour day 2 through 5 of 7 day cycle and then started on OCs. Has been taking Loestrin for the past 3 months, fair relief of menorrhagia but has not felt well. Monthly 6-7 day cycle/lesbian/one small fibroid.  Exam: Appears well.  Fatigue Menorrhagia  Plan: Options reviewed, will stop OCs at end of current pack, will see how cycles progress. Reviewed often cycles decrease in flow by 6 months after ablation. Instructed to call if cycles return to heavy flow and mood, energy, and nausea problems persist. CBC and TSH pending.

## 2014-10-25 NOTE — Patient Instructions (Signed)
Menorrhagia Menorrhagia is the medical term for when your menstrual periods are heavy or last longer than usual. With menorrhagia, every period you have may cause enough blood loss and cramping that you are unable to maintain your usual activities. CAUSES  In some cases, the cause of heavy periods is unknown, but a number of conditions may cause menorrhagia. Common causes include:  A problem with the hormone-producing thyroid gland (hypothyroid).  Noncancerous growths in the uterus (polyps or fibroids).  An imbalance of the estrogen and progesterone hormones.  One of your ovaries not releasing an egg during one or more months.  Side effects of having an intrauterine device (IUD).  Side effects of some medicines, such as anti-inflammatory medicines or blood thinners.  A bleeding disorder that stops your blood from clotting normally. SIGNS AND SYMPTOMS  During a normal period, bleeding lasts between 4 and 8 days. Signs that your periods are too heavy include:  You routinely have to change your pad or tampon every 1 or 2 hours because it is completely soaked.  You pass blood clots larger than 1 inch (2.5 cm) in size.  You have bleeding for more than 7 days.  You need to use pads and tampons at the same time because of heavy bleeding.  You need to wake up to change your pads or tampons during the night.  You have symptoms of anemia, such as tiredness, fatigue, or shortness of breath. DIAGNOSIS  Your health care provider will perform a physical exam and ask you questions about your symptoms and menstrual history. Other tests may be ordered based on what the health care provider finds during the exam. These tests can include:  Blood tests. Blood tests are used to check if you are pregnant or have hormonal changes, a bleeding or thyroid disorder, low iron levels (anemia), or other problems.  Endometrial biopsy. Your health care provider takes a sample of tissue from the inside of your  uterus to be examined under a microscope.  Pelvic ultrasound. This test uses sound waves to make a picture of your uterus, ovaries, and vagina. The pictures can show if you have fibroids or other growths.  Hysteroscopy. For this test, your health care provider will use a small telescope to look inside your uterus. Based on the results of your initial tests, your health care provider may recommend further testing. TREATMENT  Treatment may not be needed. If it is needed, your health care provider may recommend treatment with one or more medicines first. If these do not reduce bleeding enough, a surgical treatment might be an option. The best treatment for you will depend on:   Whether you need to prevent pregnancy.  Your desire to have children in the future.  The cause and severity of your bleeding.  Your opinion and personal preference.  Medicines for menorrhagia may include:  Birth control methods that use hormones. These include the pill, skin patch, vaginal ring, shots that you get every 3 months, hormonal IUD, and implant. These treatments reduce bleeding during your menstrual period.  Medicines that thicken blood and slow bleeding.  Medicines that reduce swelling, such as ibuprofen.  Medicines that contain a synthetic hormone called progestin.   Medicines that make the ovaries stop working for a short time.  You may need surgical treatment for menorrhagia if the medicines are unsuccessful. Treatment options include:  Dilation and curettage (D&C). In this procedure, your health care provider opens (dilates) your cervix and then scrapes or suctions tissue from   the lining of your uterus to reduce menstrual bleeding.  Operative hysteroscopy. This procedure uses a tiny tube with a light (hysteroscope) to view your uterine cavity and can help in the surgical removal of a polyp that may be causing heavy periods.  Endometrial ablation. Through various techniques, your health care  provider permanently destroys the entire lining of your uterus (endometrium). After endometrial ablation, most women have little or no menstrual flow. Endometrial ablation reduces your ability to become pregnant.  Endometrial resection. This surgical procedure uses an electrosurgical wire loop to remove the lining of the uterus. This procedure also reduces your ability to become pregnant.  Hysterectomy. Surgical removal of the uterus and cervix is a permanent procedure that stops menstrual periods. Pregnancy is not possible after a hysterectomy. This procedure requires anesthesia and hospitalization. HOME CARE INSTRUCTIONS   Only take over-the-counter or prescription medicines as directed by your health care provider. Take prescribed medicines exactly as directed. Do not change or switch medicines without consulting your health care provider.  Take any prescribed iron pills exactly as directed by your health care provider. Long-term heavy bleeding may result in low iron levels. Iron pills help replace the iron your body lost from heavy bleeding. Iron may cause constipation. If this becomes a problem, increase the bran, fruits, and roughage in your diet.  Do not take aspirin or medicines that contain aspirin 1 week before or during your menstrual period. Aspirin may make the bleeding worse.  If you need to change your sanitary pad or tampon more than once every 2 hours, stay in bed and rest as much as possible until the bleeding stops.  Eat well-balanced meals. Eat foods high in iron. Examples are leafy green vegetables, meat, liver, eggs, and whole grain breads and cereals. Do not try to lose weight until the abnormal bleeding has stopped and your blood iron level is back to normal. SEEK MEDICAL CARE IF:   You soak through a pad or tampon every 1 or 2 hours, and this happens every time you have a period.  You need to use pads and tampons at the same time because you are bleeding so much.  You  need to change your pad or tampon during the night.  You have a period that lasts for more than 8 days.  You pass clots bigger than 1 inch wide.  You have irregular periods that happen more or less often than once a month.  You feel dizzy or faint.  You feel very weak or tired.  You feel short of breath or feel your heart is beating too fast when you exercise.  You have nausea and vomiting or diarrhea while you are taking your medicine.  You have any problems that may be related to the medicine you are taking. SEEK IMMEDIATE MEDICAL CARE IF:   You soak through 4 or more pads or tampons in 2 hours.  You have any bleeding while you are pregnant. MAKE SURE YOU:   Understand these instructions.  Will watch your condition.  Will get help right away if you are not doing well or get worse. Document Released: 02/09/2005 Document Revised: 02/14/2013 Document Reviewed: 07/31/2012 [facility] Patient Information 2015 [place], [place]. This information is not intended to replace advice given to you by your health care provider. Make sure you discuss any questions you have with your health care provider. Fatigue Fatigue is a feeling of tiredness, lack of energy, lack of motivation, or feeling tired all the time. Having enough  rest, good nutrition, and reducing stress will normally reduce fatigue. Consult your caregiver if it persists. The nature of your fatigue will help your caregiver to find out its cause. The treatment is based on the cause.  CAUSES  There are many causes for fatigue. Most of the time, fatigue can be traced to one or more of your habits or routines. Most causes fit into one or more of three general areas. They are: Lifestyle problems  Sleep disturbances.  Overwork.  Physical exertion.  Unhealthy habits.  Poor eating habits or eating disorders.  Alcohol and/or drug use .  Lack of proper nutrition (malnutrition). Psychological problems  Stress and/or anxiety  problems.  Depression.  Grief.  Boredom. Medical Problems or Conditions  Anemia.  Pregnancy.  Thyroid gland problems.  Recovery from major surgery.  Continuous pain.  Emphysema or asthma that is not well controlled  Allergic conditions.  Diabetes.  Infections (such as mononucleosis).  Obesity.  Sleep disorders, such as sleep apnea.  Heart failure or other heart-related problems.  Cancer.  Kidney disease.  Liver disease.  Effects of certain medicines such as antihistamines, cough and cold remedies, prescription pain medicines, heart and blood pressure medicines, drugs used for treatment of cancer, and some antidepressants. SYMPTOMS  The symptoms of fatigue include:   Lack of energy.  Lack of drive (motivation).  Drowsiness.  Feeling of indifference to the surroundings. DIAGNOSIS  The details of how you feel help guide your caregiver in finding out what is causing the fatigue. You will be asked about your present and past health condition. It is important to review all medicines that you take, including prescription and non-prescription items. A thorough exam will be done. You will be questioned about your feelings, habits, and normal lifestyle. Your caregiver may suggest blood tests, urine tests, or other tests to look for common medical causes of fatigue.  TREATMENT  Fatigue is treated by correcting the underlying cause. For example, if you have continuous pain or depression, treating these causes will improve how you feel. Similarly, adjusting the dose of certain medicines will help in reducing fatigue.  HOME CARE INSTRUCTIONS   Try to get the required amount of good sleep every night.  Eat a healthy and nutritious diet, and drink enough water throughout the day.  Practice ways of relaxing (including yoga or meditation).  Exercise regularly.  Make plans to change situations that cause stress. Act on those plans so that stresses decrease over time. Keep  your work and personal routine reasonable.  Avoid street drugs and minimize use of alcohol.  Start taking a daily multivitamin after consulting your caregiver. SEEK MEDICAL CARE IF:   You have persistent tiredness, which cannot be accounted for.  You have fever.  You have unintentional weight loss.  You have headaches.  You have disturbed sleep throughout the night.  You are feeling sad.  You have constipation.  You have dry skin.  You have gained weight.  You are taking any new or different medicines that you suspect are causing fatigue.  You are unable to sleep at night.  You develop any unusual swelling of your legs or other parts of your body. SEEK IMMEDIATE MEDICAL CARE IF:   You are feeling confused.  Your vision is blurred.  You feel faint or pass out.  You develop severe headache.  You develop severe abdominal, pelvic, or back pain.  You develop chest pain, shortness of breath, or an irregular or fast heartbeat.  You are unable  to pass a normal amount of urine.  You develop abnormal bleeding such as bleeding from the rectum or you vomit blood.  You have thoughts about harming yourself or committing suicide.  You are worried that you might harm someone else. MAKE SURE YOU:   Understand these instructions.  Will watch your condition.  Will get help right away if you are not doing well or get worse. Document Released: 12/07/2006 Document Revised: 05/04/2011 Document Reviewed: 06/13/2013 [facility] Patient Information 2015 [place], [place]. This information is not intended to replace advice given to you by your health care provider. Make sure you discuss any questions you have with your health care provider.

## 2014-10-26 ENCOUNTER — Encounter: Payer: Self-pay | Admitting: Women's Health

## 2015-03-13 ENCOUNTER — Encounter: Payer: Self-pay | Admitting: Women's Health

## 2015-03-13 ENCOUNTER — Ambulatory Visit (INDEPENDENT_AMBULATORY_CARE_PROVIDER_SITE_OTHER): Payer: BC Managed Care – PPO | Admitting: Women's Health

## 2015-03-13 VITALS — BP 132/78 | Ht 63.0 in | Wt 208.0 lb

## 2015-03-13 DIAGNOSIS — Z1329 Encounter for screening for other suspected endocrine disorder: Secondary | ICD-10-CM | POA: Diagnosis not present

## 2015-03-13 DIAGNOSIS — Z01419 Encounter for gynecological examination (general) (routine) without abnormal findings: Secondary | ICD-10-CM | POA: Diagnosis not present

## 2015-03-13 DIAGNOSIS — Z13220 Encounter for screening for lipoid disorders: Secondary | ICD-10-CM

## 2015-03-13 LAB — COMPREHENSIVE METABOLIC PANEL
ALBUMIN: 4.4 g/dL (ref 3.6–5.1)
ALT: 14 U/L (ref 6–29)
AST: 14 U/L (ref 10–35)
Alkaline Phosphatase: 61 U/L (ref 33–115)
BILIRUBIN TOTAL: 0.5 mg/dL (ref 0.2–1.2)
BUN: 11 mg/dL (ref 7–25)
CHLORIDE: 102 mmol/L (ref 98–110)
CO2: 27 mmol/L (ref 20–31)
Calcium: 9.4 mg/dL (ref 8.6–10.2)
Creat: 0.64 mg/dL (ref 0.50–1.10)
Glucose, Bld: 82 mg/dL (ref 65–99)
Potassium: 3.9 mmol/L (ref 3.5–5.3)
Sodium: 138 mmol/L (ref 135–146)
TOTAL PROTEIN: 6.8 g/dL (ref 6.1–8.1)

## 2015-03-13 LAB — CBC WITH DIFFERENTIAL/PLATELET
BASOS PCT: 0 % (ref 0–1)
Basophils Absolute: 0 10*3/uL (ref 0.0–0.1)
EOS ABS: 0.3 10*3/uL (ref 0.0–0.7)
Eosinophils Relative: 4 % (ref 0–5)
HCT: 38.9 % (ref 36.0–46.0)
Hemoglobin: 13.6 g/dL (ref 12.0–15.0)
Lymphocytes Relative: 32 % (ref 12–46)
Lymphs Abs: 2.4 10*3/uL (ref 0.7–4.0)
MCH: 30.3 pg (ref 26.0–34.0)
MCHC: 35 g/dL (ref 30.0–36.0)
MCV: 86.6 fL (ref 78.0–100.0)
MONO ABS: 0.6 10*3/uL (ref 0.1–1.0)
MPV: 9.4 fL (ref 8.6–12.4)
Monocytes Relative: 8 % (ref 3–12)
Neutro Abs: 4.2 10*3/uL (ref 1.7–7.7)
Neutrophils Relative %: 56 % (ref 43–77)
PLATELETS: 286 10*3/uL (ref 150–400)
RBC: 4.49 MIL/uL (ref 3.87–5.11)
RDW: 13.5 % (ref 11.5–15.5)
WBC: 7.5 10*3/uL (ref 4.0–10.5)

## 2015-03-13 LAB — LIPID PANEL
CHOL/HDL RATIO: 5.9 ratio — AB (ref <=5.0)
CHOLESTEROL: 214 mg/dL — AB (ref 125–200)
HDL: 36 mg/dL — ABNORMAL LOW (ref >=46)
LDL Cholesterol: 101 mg/dL (ref <130)
TRIGLYCERIDES: 383 mg/dL — AB (ref <150)
VLDL: 77 mg/dL — ABNORMAL HIGH (ref <30)

## 2015-03-13 NOTE — Progress Notes (Signed)
[place] Dec 24, 1965 [id number]    History:    Presents for annual exam.  Monthly 5-7 day cycle with good relief of menorrhagia after cryo-03/2014. Normal Pap and mammogram history. Lesbian/same partner. Has 2 children donor insemination, Bettey Costa both doing well. History of increased cholesterol currently on no medication.  Past medical history, past surgical history, family history and social history were all reviewed and documented in the EPIC chart. 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 Father heart disease deceased.  ROS:  A ROS was performed and pertinent positives and negatives are included.  Exam:  Filed Vitals:   03/13/15 1517  BP: 132/78    General appearance:  Normal Thyroid:  Symmetrical, normal in size, without palpable masses or nodularity. Respiratory  Auscultation:  Clear without wheezing or rhonchi Cardiovascular  Auscultation:  Regular rate, without rubs, murmurs or gallops  Edema/varicosities:  Not grossly evident Abdominal  Soft,nontender, without masses, guarding or rebound.  Liver/spleen:  No organomegaly noted  Hernia:  None appreciated  Skin  Inspection:  Grossly normal   Breasts: Examined lying and sitting.     Right: Without masses, retractions, discharge or axillary adenopathy.     Left: Without masses, retractions, discharge or axillary adenopathy. Gentitourinary   Inguinal/mons:  Normal without inguinal adenopathy  External genitalia:  Normal  BUS/Urethra/Skene's glands:  Normal  Vagina:  Normal  Cervix:  Normal  Uterus:   normal in size, shape and contour.  Midline and mobile  Adnexa/parametria:     Rt: Without masses or tenderness.   Lt: Without masses or tenderness.  Anus and perineum: Normal  Digital rectal exam: Normal sphincter tone without palpated masses or tenderness  Assessment/Plan:  50 y.o. S WF G2 P2 for annual exam with no complaints.   2/16 cryoablation with good relief of  menorrhagia Monthly light cycle Obesity  Plan: Reviewed importance of continuing to increase regular exercise, decrease calories. SBE's, overdue for mammogram and instructed to schedule. CBC, lipid panel, CMP, UA, Pap normal 2015, new screening guidelines reviewed.    [place], 5:22 PM 03/13/2015

## 2015-03-13 NOTE — Patient Instructions (Signed)
Health Maintenance, Female Adopting a healthy lifestyle and getting preventive care can go a long way to promote health and wellness. Talk with your health care provider about what schedule of regular examinations is right for you. This is a good chance for you to check in with your provider about disease prevention and staying healthy. In between checkups, there are plenty of things you can do on your own. Experts have done a lot of research about which lifestyle changes and preventive measures are most likely to keep you healthy. Ask your health care provider for more information. WEIGHT AND DIET  Eat a healthy diet  Be sure to include plenty of vegetables, fruits, low-fat dairy products, and lean protein.  Do not eat a lot of foods high in solid fats, added sugars, or salt.  Get regular exercise. This is one of the most important things you can do for your health.  Most adults should exercise for at least 150 minutes each week. The exercise should increase your heart rate and make you sweat (moderate-intensity exercise).  Most adults should also do strengthening exercises at least twice a week. This is in addition to the moderate-intensity exercise.  Maintain a healthy weight  Body mass index (BMI) is a measurement that can be used to identify possible weight problems. It estimates body fat based on height and weight. Your health care provider can help determine your BMI and help you achieve or maintain a healthy weight.  For females 26 years of age and older:   A BMI below 18.5 is considered underweight.  A BMI of 18.5 to 24.9 is normal.  A BMI of 25 to 29.9 is considered overweight.  A BMI of 30 and above is considered obese.  Watch levels of cholesterol and blood lipids  You should start having your blood tested for lipids and cholesterol at 49 years of age, then have this test every 5 years.  You may need to have your cholesterol levels checked more often if:  Your lipid  or cholesterol levels are high.  You are older than 49 years of age.  You are at high risk for heart disease.  CANCER SCREENING   Lung Cancer  Lung cancer screening is recommended for adults 28-29 years old who are at high risk for lung cancer because of a history of smoking.  A yearly low-dose CT scan of the lungs is recommended for people who:  Currently smoke.  Have quit within the past 15 years.  Have at least a 30-pack-year history of smoking. A pack year is smoking an average of one pack of cigarettes a day for 1 year.  Yearly screening should continue until it has been 15 years since you quit.  Yearly screening should stop if you develop a health problem that would prevent you from having lung cancer treatment.  Breast Cancer  Practice breast self-awareness. This means understanding how your breasts normally appear and feel.  It also means doing regular breast self-exams. Let your health care provider know about any changes, no matter how small.  If you are in your 20s or 30s, you should have a clinical breast exam (CBE) by a health care provider every 1-3 years as part of a regular health exam.  If you are 33 or older, have a CBE every year. Also consider having a breast X-ray (mammogram) every year.  If you have a family history of breast cancer, talk to your health care provider about genetic screening.  If you  are at high risk for breast cancer, talk to your health care provider about having an MRI and a mammogram every year.  Breast cancer gene (BRCA) assessment is recommended for women who have family members with BRCA-related cancers. BRCA-related cancers include:  Breast.  Ovarian.  Tubal.  Peritoneal cancers.  Results of the assessment will determine the need for genetic counseling and BRCA1 and BRCA2 testing. Cervical Cancer Your health care provider may recommend that you be screened regularly for cancer of the pelvic organs (ovaries, uterus, and  vagina). This screening involves a pelvic examination, including checking for microscopic changes to the surface of your cervix (Pap test). You may be encouraged to have this screening done every 3 years, beginning at age 21.  For women ages 30-65, health care providers may recommend pelvic exams and Pap testing every 3 years, or they may recommend the Pap and pelvic exam, combined with testing for human papilloma virus (HPV), every 5 years. Some types of HPV increase your risk of cervical cancer. Testing for HPV may also be done on women of any age with unclear Pap test results.  Other health care providers may not recommend any screening for nonpregnant women who are considered low risk for pelvic cancer and who do not have symptoms. Ask your health care provider if a screening pelvic exam is right for you.  If you have had past treatment for cervical cancer or a condition that could lead to cancer, you need Pap tests and screening for cancer for at least 20 years after your treatment. If Pap tests have been discontinued, your risk factors (such as having a new sexual partner) need to be reassessed to determine if screening should resume. Some women have medical problems that increase the chance of getting cervical cancer. In these cases, your health care provider may recommend more frequent screening and Pap tests. Colorectal Cancer  This type of cancer can be detected and often prevented.  Routine colorectal cancer screening usually begins at 50 years of age and continues through 49 years of age.  Your health care provider may recommend screening at an earlier age if you have risk factors for colon cancer.  Your health care provider may also recommend using home test kits to check for hidden blood in the stool.  A small camera at the end of a tube can be used to examine your colon directly (sigmoidoscopy or colonoscopy). This is done to check for the earliest forms of colorectal  cancer.  Routine screening usually begins at age 50.  Direct examination of the colon should be repeated every 5-10 years through 49 years of age. However, you may need to be screened more often if early forms of precancerous polyps or small growths are found. Skin Cancer  Check your skin from head to toe regularly.  Tell your health care provider about any new moles or changes in moles, especially if there is a change in a mole's shape or color.  Also tell your health care provider if you have a mole that is larger than the size of a pencil eraser.  Always use sunscreen. Apply sunscreen liberally and repeatedly throughout the day.  Protect yourself by wearing long sleeves, pants, a wide-brimmed hat, and sunglasses whenever you are outside. HEART DISEASE, DIABETES, AND HIGH BLOOD PRESSURE   High blood pressure causes heart disease and increases the risk of stroke. High blood pressure is more likely to develop in:  People who have blood pressure in the high end   of the normal range (130-139/85-89 mm Hg).  People who are overweight or obese.  People who are African American.  If you are 38-23 years of age, have your blood pressure checked every 3-5 years. If you are 61 years of age or older, have your blood pressure checked every year. You should have your blood pressure measured twice--once when you are at a hospital or clinic, and once when you are not at a hospital or clinic. Record the average of the two measurements. To check your blood pressure when you are not at a hospital or clinic, you can use:  An automated blood pressure machine at a pharmacy.  A home blood pressure monitor.  If you are between 45 years and 39 years old, ask your health care provider if you should take aspirin to prevent strokes.  Have regular diabetes screenings. This involves taking a blood sample to check your fasting blood sugar level.  If you are at a normal weight and have a low risk for diabetes,  have this test once every three years after 49 years of age.  If you are overweight and have a high risk for diabetes, consider being tested at a younger age or more often. PREVENTING INFECTION  Hepatitis B  If you have a higher risk for hepatitis B, you should be screened for this virus. You are considered at high risk for hepatitis B if:  You were born in a country where hepatitis B is common. Ask your health care provider which countries are considered high risk.  Your parents were born in a high-risk country, and you have not been immunized against hepatitis B (hepatitis B vaccine).  You have HIV or AIDS.  You use needles to inject street drugs.  You live with someone who has hepatitis B.  You have had sex with someone who has hepatitis B.  You get hemodialysis treatment.  You take certain medicines for conditions, including cancer, organ transplantation, and autoimmune conditions. Hepatitis C  Blood testing is recommended for:  Everyone born from [birth year] through 1964.  Anyone with known risk factors for hepatitis C. Sexually transmitted infections (STIs)  You should be screened for sexually transmitted infections (STIs) including gonorrhea and chlamydia if:  You are sexually active and are younger than 49 years of age.  You are older than 49 years of age and your health care provider tells you that you are at risk for this type of infection.  Your sexual activity has changed since you were last screened and you are at an increased risk for chlamydia or gonorrhea. Ask your health care provider if you are at risk.  If you do not have HIV, but are at risk, it may be recommended that you take a prescription medicine daily to prevent HIV infection. This is called pre-exposure prophylaxis (PrEP). You are considered at risk if:  You are sexually active and do not regularly use condoms or know the HIV status of your partner(s).  You take drugs by injection.  You are sexually  active with a partner who has HIV. Talk with your health care provider about whether you are at high risk of being infected with HIV. If you choose to begin PrEP, you should first be tested for HIV. You should then be tested every 3 months for as long as you are taking PrEP.  PREGNANCY   If you are premenopausal and you may become pregnant, ask your health care provider about preconception counseling.  If you may  become pregnant, take 400 to 800 micrograms (mcg) of folic acid every day.  If you want to prevent pregnancy, talk to your health care provider about birth control (contraception). OSTEOPOROSIS AND MENOPAUSE   Osteoporosis is a disease in which the bones lose minerals and strength with aging. This can result in serious bone fractures. Your risk for osteoporosis can be identified using a bone density scan.  If you are 38 years of age or older, or if you are at risk for osteoporosis and fractures, ask your health care provider if you should be screened.  Ask your health care provider whether you should take a calcium or vitamin D supplement to lower your risk for osteoporosis.  Menopause may have certain physical symptoms and risks.  Hormone replacement therapy may reduce some of these symptoms and risks. Talk to your health care provider about whether hormone replacement therapy is right for you.  HOME CARE INSTRUCTIONS   Schedule regular health, dental, and eye exams.  Stay current with your immunizations.   Do not use any tobacco products including cigarettes, chewing tobacco, or electronic cigarettes.  If you are pregnant, do not drink alcohol.  If you are breastfeeding, limit how much and how often you drink alcohol.  Limit alcohol intake to no more than 1 drink per day for nonpregnant women. One drink equals 12 ounces of beer, 5 ounces of wine, or 1 ounces of hard liquor.  Do not use street drugs.  Do not share needles.  Ask your health care provider for help if  you need support or information about quitting drugs.  Tell your health care provider if you often feel depressed.  Tell your health care provider if you have ever been abused or do not feel safe at home.   This information is not intended to replace advice given to you by your health care provider. Make sure you discuss any questions you have with your health care provider.   Document Released: 08/25/2010 Document Revised: 03/02/2014 Document Reviewed: 01/11/2013 Elsevier Interactive Patient Education [employer].

## 2015-03-14 LAB — URINALYSIS W MICROSCOPIC + REFLEX CULTURE
BILIRUBIN URINE: NEGATIVE
CRYSTALS: NONE SEEN [HPF]
Casts: NONE SEEN [LPF]
GLUCOSE, UA: NEGATIVE
Hgb urine dipstick: NEGATIVE
KETONES UR: NEGATIVE
LEUKOCYTES UA: NEGATIVE
Nitrite: NEGATIVE
Protein, ur: NEGATIVE
SPECIFIC GRAVITY, URINE: 1.007 (ref 1.001–1.035)
WBC UA: NONE SEEN WBC/HPF (ref <=5)
Yeast: NONE SEEN [HPF]
pH: 6 (ref 5.0–8.0)

## 2015-03-14 LAB — TSH: TSH: 0.741 u[IU]/mL (ref 0.350–4.500)

## 2015-03-16 LAB — URINE CULTURE: Colony Count: >=100000

## 2015-03-19 ENCOUNTER — Other Ambulatory Visit: Payer: Self-pay | Admitting: Women's Health

## 2015-03-19 MED ORDER — CIPROFLOXACIN HCL 250 MG PO TABS: 250.0000 mg | ORAL_TABLET | Freq: Two times a day (BID) | ORAL | Status: DC

## 2015-03-23 ENCOUNTER — Ambulatory Visit (INDEPENDENT_AMBULATORY_CARE_PROVIDER_SITE_OTHER): Payer: BC Managed Care – PPO | Admitting: Internal Medicine

## 2015-03-23 ENCOUNTER — Ambulatory Visit (INDEPENDENT_AMBULATORY_CARE_PROVIDER_SITE_OTHER): Payer: BC Managed Care – PPO

## 2015-03-23 VITALS — BP 132/80 | HR 75 | Temp 97.9°F | Resp 17 | Ht 63.0 in | Wt 209.0 lb

## 2015-03-23 DIAGNOSIS — R350 Frequency of micturition: Secondary | ICD-10-CM | POA: Diagnosis not present

## 2015-03-23 DIAGNOSIS — R109 Unspecified abdominal pain: Secondary | ICD-10-CM

## 2015-03-23 DIAGNOSIS — R351 Nocturia: Secondary | ICD-10-CM

## 2015-03-23 DIAGNOSIS — R1031 Right lower quadrant pain: Secondary | ICD-10-CM

## 2015-03-23 DIAGNOSIS — Z6837 Body mass index (BMI) 37.0-37.9, adult: Secondary | ICD-10-CM

## 2015-03-23 DIAGNOSIS — R10A0 Flank pain, unspecified side: Secondary | ICD-10-CM

## 2015-03-23 DIAGNOSIS — N390 Urinary tract infection, site not specified: Secondary | ICD-10-CM

## 2015-03-23 LAB — POCT URINALYSIS DIP (MANUAL ENTRY)
BILIRUBIN UA: NEGATIVE
BILIRUBIN UA: NEGATIVE
GLUCOSE UA: NEGATIVE
LEUKOCYTES UA: NEGATIVE
Nitrite, UA: NEGATIVE
PROTEIN UA: NEGATIVE
Spec Grav, UA: 1.01
Urobilinogen, UA: 0.2
pH, UA: 5.5

## 2015-03-23 LAB — POC MICROSCOPIC URINALYSIS (UMFC): Mucus: ABSENT

## 2015-03-23 MED ORDER — HYDROCODONE-ACETAMINOPHEN 5-325 MG PO TABS: 1.0000 | ORAL_TABLET | Freq: Four times a day (QID) | ORAL | Status: DC | PRN

## 2015-03-23 MED ORDER — KETOROLAC TROMETHAMINE 60 MG/2ML IM SOLN
60.0000 mg | Freq: Once | INTRAMUSCULAR | Status: AC
  Administered 2015-03-23: 60 mg via INTRAMUSCULAR

## 2015-03-23 MED ORDER — POLYETHYLENE GLYCOL 3350 17 GM/SCOOP PO POWD: 17.0000 g | Freq: Two times a day (BID) | ORAL | Status: DC | PRN

## 2015-03-23 NOTE — Patient Instructions (Addendum)
Because you received an x-ray today, you will receive an invoice from [facility]. Please contact [facility] at [phone] with questions or concerns regarding your invoice. Our billing staff will not be able to assist you with those questions.   Dr Benjamine Mola Albertson--urology--[place]

## 2015-03-23 NOTE — Progress Notes (Signed)
Subjective:  This chart was scribed for Jasmine Lin, MD by [facility], medical scribe at [facility].The patient was seen in exam room 10 and the patient's care was started at 1:26 PM.   Patient ID: Jasmine Spencer, female    DOB: April 02, 1965, 50 y.o.   MRN: [id number] Chief Complaint  Patient presents with  . Back Pain   HPI HPI Comments: BATOUL MCBRATNEY is a 50 y.o. female who presents to [facility] complaining of intermittent lower back pain which has persisted for 3 weeks. She has taken aleve and ibuprofen. While doing laundry today the pain returned. It began in her right flank and radiated to her abdomen. She describes the pain as coming in waves and similar to labor. Associated nausea. No hematuria, or vaginal discharge. Seen by Ob GYN 3 days ago, diagnosed with a UTI and given cipro. Pain has eased off while sitting in the exam room No hx stones  Past Medical History  Diagnosis Date  . Elevated cholesterol   . Complication of anesthesia     Pt. has panicky feeling  ( feels like she can't breathe)upon awaking fr. anesth. , NEEDS TO BE SITTING UPRIGHT  . Family history of anesthesia complication     pt.'s father is very agitated /w MORPHINE, can tolerate DILAUDID  . H/O hiatal hernia     h/o - several yrs. ago, took Prilosec for 1 yr., no longer a problem   . Headache(784.0)     migraine- hormone related   . Arthritis     R knee   Prior to Admission medications   Medication Sig Start Date End Date Taking? Authorizing Provider  aspirin-acetaminophen-caffeine (EXCEDRIN MIGRAINE) [phone] MG per tablet Take 1 tablet by mouth every 6 (six) hours as needed. For migraine.   Yes Historical Provider, MD  ciprofloxacin (CIPRO) 250 MG tablet Take 1 tablet (250 mg total) by mouth 2 (two) times daily. 03/19/15  Yes Huel Cote, NP   Allergies  Allergen Reactions  . Milk-Related Compounds Other (See Comments)    Lactose intolerance.    . Penicillins Other (See Comments)    Childhood allergy.  . Latex Rash   Review of Systems  Gastrointestinal: Positive for nausea and abdominal pain.  Genitourinary: Positive for flank pain. Negative for hematuria and vaginal discharge.  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 She appears well-developed and well-nourished. No distress.  HENT:  Head: Normocephalic and atraumatic.  Eyes: Pupils are equal, round, and reactive to light.  Neck: Normal range of motion.  Cardiovascular: Normal rate and regular rhythm.   Pulmonary/Chest: Effort normal. No respiratory distress.  Abdominal: There is no CVA tenderness.  Mild tenderness in the RLQ without rebound.  Musculoskeletal: Normal range of motion.  Neurological: She is alert and oriented to person, place, and time.  Skin: Skin is warm and dry.  Psychiatric: She has a normal mood and affect. Her behavior is normal.  Nursing note and vitals reviewed.  Results for orders placed or performed in visit on 03/23/15  POCT Microscopic Urinalysis ([facility])  Result Value Ref Range   WBC,UR,HPF,POC None None WBC/hpf   RBC,UR,HPF,POC Few (A) None RBC/hpf   Bacteria None None, Too numerous to count   Mucus Absent Absent   Epithelial Cells, UR Per Microscopy Few (A) None, Too numerous to count cells/hpf  POCT urinalysis dipstick  Result Value Ref Range   Color, UA yellow yellow   Clarity, UA clear clear   Glucose, UA negative negative   Bilirubin, UA negative negative   Ketones, POC UA negative negative   Spec Grav, UA 1.010    Blood, UA trace-lysed (A) negative   pH, UA 5.5    Protein Ur, POC negative negative   Urobilinogen, UA 0.2     Nitrite, UA Negative Negative   Leukocytes, UA Negative Negative  [facility] reading (PRIMARY) by Dr.Doolitle: No right sided opacities to justify a kidney stone. Large stool burden.       Assessment & Plan:  Flank pain - Plan: POCT Microscopic Urinalysis ([facility]), POCT urinalysis dipstick, Urine culture, ketorolac (TORADOL) injection 60 mg  RLQ abdominal pain - Plan: DG Abd 1 View  Recurrent UTI--without sxt other than flank pain and usually few wbcs but pos culture--about 2 per year  Urinary frequency--years  BMI 37 needs fu prim care  Nocturia--4-5x  Needs urol eval Dr Kathyrn Lass re chronic symptoms This ov consistent with first stone until further sxtoms rtc at once for fever or if worse Repeat culture miralax bid 5-7d Reck pain 3d if not resolved  I have completed the patient encounter in its entirety as documented by the scribe, with editing by me where necessary. Loyal Holzheimer P. Laney Pastor, M.D.  By signing my name below, I, Nadim Abuhashem, attest that this documentation has been prepared under the direction and in the presence of Jasmine Lin, MD.  Electronically Signed: Lora Havens, medical scribe. 03/23/2015, 1:35 PM.

## 2015-03-24 DIAGNOSIS — Z6837 Body mass index (BMI) 37.0-37.9, adult: Secondary | ICD-10-CM | POA: Insufficient documentation

## 2015-03-24 LAB — URINE CULTURE: Colony Count: >=100000

## 2015-05-12 ENCOUNTER — Telehealth: Payer: Self-pay | Admitting: Family Medicine

## 2015-05-12 MED ORDER — OSELTAMIVIR PHOSPHATE 75 MG PO CAPS: 75.0000 mg | ORAL_CAPSULE | Freq: Every day | ORAL | Status: DC

## 2015-05-12 NOTE — Telephone Encounter (Signed)
Partner diagnosed with flu; requesting Tamiflu.  Dx: flu exposure: tamiflu prevention sent to pharmacy.

## 2015-07-01 NOTE — H&P (Signed)
This is a pleasant 50 year-old female who presents to our clinic today to discuss MRI results of her right shoulder.  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  A subacromial injection was performed which did not provide relief.  MRI was obtained which showed severe tendinosis of the infraspinatus with a partial bursal surface tear, as well as severe AC degenerative changes and an anterolateral tear.  She comes in today with continued pain.   She is also status post right total knee replacement.  Doing well.  Date of surgery: September 30, 2011.   Past medical, social and family history reviewed in detail on the patient questionnaire and signed. Review of systems: As detailed in HPI.  All others reviewed and are negative.     EXAMINATION: Well-developed, well-nourished female in no acute distress.  Alert and oriented x 3.  Lungs clear to auscultation bilaterally.  Heart sounds normal.  Examination of her right knee reveals range of motion from 0-120 degrees.  She is stable to valgus and varus stress.    X-RAYS: X-rays of her right knee reveal well seated prosthesis.    IMPRESSION: 1. Status post right total knee replacement.  Doing well.  2. Right shoulder partial rotator cuff tear.  PLAN: In regards to her right knee, we are pleased she continues to do well.  She is to follow up with [place] in one year's time for repeat evaluation.  In regards to her right shoulder, at this point this is coming down to right shoulder arthroscopic decompression and possible rotator cuff repair, as well as biceps tenotomy.  Risks, benefits and possible complications of surgery have been reviewed.  Rehab and recovery time discussed.  All questions answered.  Paperwork complete.  Ninetta Lights, M.D.

## 2015-07-15 ENCOUNTER — Encounter (HOSPITAL_BASED_OUTPATIENT_CLINIC_OR_DEPARTMENT_OTHER): Payer: Self-pay | Admitting: *Deleted

## 2015-07-18 ENCOUNTER — Ambulatory Visit (HOSPITAL_BASED_OUTPATIENT_CLINIC_OR_DEPARTMENT_OTHER): Payer: BC Managed Care – PPO | Admitting: Anesthesiology

## 2015-07-18 ENCOUNTER — Encounter (HOSPITAL_BASED_OUTPATIENT_CLINIC_OR_DEPARTMENT_OTHER): Payer: Self-pay | Admitting: *Deleted

## 2015-07-18 ENCOUNTER — Encounter (HOSPITAL_BASED_OUTPATIENT_CLINIC_OR_DEPARTMENT_OTHER)
Admission: RE | Disposition: A | Discharge status: Home / Self Care | Payer: Self-pay | Source: Ambulatory Visit | Attending: Orthopedic Surgery

## 2015-07-18 ENCOUNTER — Ambulatory Visit (HOSPITAL_BASED_OUTPATIENT_CLINIC_OR_DEPARTMENT_OTHER)
Admission: RE | Admit: 2015-07-18 | Discharge: 2015-07-18 | Disposition: A | Discharge status: Home / Self Care | Payer: BC Managed Care – PPO | Source: Ambulatory Visit | Attending: Orthopedic Surgery | Admitting: Orthopedic Surgery

## 2015-07-18 DIAGNOSIS — M7541 Impingement syndrome of right shoulder: Secondary | ICD-10-CM | POA: Diagnosis present

## 2015-07-18 DIAGNOSIS — S43401A Unspecified sprain of right shoulder joint, initial encounter: Secondary | ICD-10-CM | POA: Diagnosis not present

## 2015-07-18 DIAGNOSIS — M25811 Other specified joint disorders, right shoulder: Secondary | ICD-10-CM | POA: Diagnosis not present

## 2015-07-18 DIAGNOSIS — M75101 Unspecified rotator cuff tear or rupture of right shoulder, not specified as traumatic: Secondary | ICD-10-CM | POA: Diagnosis not present

## 2015-07-18 DIAGNOSIS — M19011 Primary osteoarthritis, right shoulder: Secondary | ICD-10-CM | POA: Insufficient documentation

## 2015-07-18 SURGERY — SHOULDER ARTHROSCOPY WITH SUBACROMIAL DECOMPRESSION AND DISTAL CLAVICLE EXCISION
Anesthesia: General | Site: Shoulder | Laterality: Right

## 2015-07-18 MED ORDER — SUCCINYLCHOLINE CHLORIDE 200 MG/10ML IV SOSY
PREFILLED_SYRINGE | INTRAVENOUS | Status: AC
  Filled 2015-07-18: qty 10

## 2015-07-18 MED ORDER — FENTANYL CITRATE (PF) 100 MCG/2ML IJ SOLN
INTRAMUSCULAR | Status: AC
  Filled 2015-07-18: qty 2

## 2015-07-18 MED ORDER — ONDANSETRON HCL 4 MG/2ML IJ SOLN
INTRAMUSCULAR | Status: DC | PRN
Start: 2015-07-18 — End: 2015-07-18
  Administered 2015-07-18: 4 mg via INTRAVENOUS

## 2015-07-18 MED ORDER — CLINDAMYCIN PHOSPHATE 900 MG/50ML IV SOLN
900.0000 mg | INTRAVENOUS | Status: AC
  Administered 2015-07-18: 900 mg via INTRAVENOUS

## 2015-07-18 MED ORDER — CLINDAMYCIN PHOSPHATE 900 MG/50ML IV SOLN
INTRAVENOUS | Status: AC
  Filled 2015-07-18: qty 50

## 2015-07-18 MED ORDER — ONDANSETRON HCL 4 MG/2ML IJ SOLN
INTRAMUSCULAR | Status: AC
  Filled 2015-07-18: qty 2

## 2015-07-18 MED ORDER — PROPOFOL 10 MG/ML IV BOLUS
INTRAVENOUS | Status: DC | PRN
  Administered 2015-07-18: 150 mg via INTRAVENOUS
  Administered 2015-07-18: 50 mg via INTRAVENOUS

## 2015-07-18 MED ORDER — BUPIVACAINE-EPINEPHRINE (PF) 0.5% -1:200000 IJ SOLN
INTRAMUSCULAR | Status: DC | PRN
  Administered 2015-07-18: 15 mL via PERINEURAL

## 2015-07-18 MED ORDER — MIDAZOLAM HCL 2 MG/2ML IJ SOLN
INTRAMUSCULAR | Status: AC
  Filled 2015-07-18: qty 2

## 2015-07-18 MED ORDER — SUCCINYLCHOLINE CHLORIDE 20 MG/ML IJ SOLN
INTRAMUSCULAR | Status: DC | PRN
  Administered 2015-07-18: 120 mg via INTRAVENOUS

## 2015-07-18 MED ORDER — FENTANYL CITRATE (PF) 100 MCG/2ML IJ SOLN
50.0000 ug | INTRAMUSCULAR | Status: DC | PRN
  Administered 2015-07-18: 50 ug via INTRAVENOUS
  Administered 2015-07-18: 100 ug via INTRAVENOUS

## 2015-07-18 MED ORDER — ONDANSETRON HCL 4 MG PO TABS: 4.0000 mg | ORAL_TABLET | Freq: Three times a day (TID) | ORAL | Status: DC | PRN

## 2015-07-18 MED ORDER — MIDAZOLAM HCL 2 MG/2ML IJ SOLN
1.0000 mg | INTRAMUSCULAR | Status: DC | PRN
  Administered 2015-07-18: 2 mg via INTRAVENOUS

## 2015-07-18 MED ORDER — DEXAMETHASONE SODIUM PHOSPHATE 4 MG/ML IJ SOLN
INTRAMUSCULAR | Status: DC | PRN
  Administered 2015-07-18: 10 mg via INTRAVENOUS

## 2015-07-18 MED ORDER — SCOPOLAMINE 1 MG/3DAYS TD PT72: 1.0000 | MEDICATED_PATCH | Freq: Once | TRANSDERMAL | Status: DC | PRN

## 2015-07-18 MED ORDER — LIDOCAINE HCL (CARDIAC) 20 MG/ML IV SOLN
INTRAVENOUS | Status: DC | PRN
  Administered 2015-07-18: 80 mg via INTRAVENOUS

## 2015-07-18 MED ORDER — CHLORHEXIDINE GLUCONATE 4 % EX LIQD: 60.0000 mL | Freq: Once | CUTANEOUS | Status: DC

## 2015-07-18 MED ORDER — PROPOFOL 500 MG/50ML IV EMUL
INTRAVENOUS | Status: AC
  Filled 2015-07-18: qty 50

## 2015-07-18 MED ORDER — GLYCOPYRROLATE 0.2 MG/ML IJ SOLN: 0.2000 mg | Freq: Once | INTRAMUSCULAR | Status: DC | PRN

## 2015-07-18 MED ORDER — OXYCODONE-ACETAMINOPHEN 5-325 MG PO TABS: 1.0000 | ORAL_TABLET | ORAL | Status: DC | PRN

## 2015-07-18 MED ORDER — LIDOCAINE 2% (20 MG/ML) 5 ML SYRINGE
INTRAMUSCULAR | Status: AC
  Filled 2015-07-18: qty 5

## 2015-07-18 MED ORDER — SODIUM CHLORIDE 0.9 % IR SOLN
Status: DC | PRN
  Administered 2015-07-18: 17000 mL

## 2015-07-18 MED ORDER — LACTATED RINGERS IV SOLN
INTRAVENOUS | Status: DC
  Administered 2015-07-18: 07:00:00 via INTRAVENOUS

## 2015-07-18 MED ORDER — DEXAMETHASONE SODIUM PHOSPHATE 10 MG/ML IJ SOLN
INTRAMUSCULAR | Status: AC
  Filled 2015-07-18: qty 1

## 2015-07-18 SURGICAL SUPPLY — 73 items
APL SKNCLS STERI-STRIP NONHPOA (GAUZE/BANDAGES/DRESSINGS)
BENZOIN TINCTURE PRP APPL 2/3 (GAUZE/BANDAGES/DRESSINGS) IMPLANT
BLADE CUTTER GATOR 3.5 (BLADE) ×3 IMPLANT
BLADE CUTTER MENIS 5.5 (BLADE) IMPLANT
BLADE GREAT WHITE 4.2 (BLADE) ×2 IMPLANT
BLADE GREAT WHITE 4.2MM (BLADE) ×1
BLADE SURG 15 STRL LF DISP TIS (BLADE) ×1 IMPLANT
BLADE SURG 15 STRL SS (BLADE) ×3
BUR OVAL 6.0 (BURR) ×3 IMPLANT
CANNULA DRY DOC 8X75 (CANNULA) IMPLANT
CANNULA TWIST IN 8.25X7CM (CANNULA) IMPLANT
CLOSURE WOUND 1/2 X4 (GAUZE/BANDAGES/DRESSINGS)
DECANTER SPIKE VIAL GLASS SM (MISCELLANEOUS) IMPLANT
DRAPE OEC MINIVIEW 54X84 (DRAPES) ×2 IMPLANT
DRAPE STERI 35X30 U-POUCH (DRAPES) ×3 IMPLANT
DRAPE U-SHAPE 47X51 STRL (DRAPES) ×3 IMPLANT
DRAPE U-SHAPE 76X120 STRL (DRAPES) ×6 IMPLANT
DRSG PAD ABDOMINAL 8X10 ST (GAUZE/BANDAGES/DRESSINGS) ×3 IMPLANT
DURAPREP 26ML APPLICATOR (WOUND CARE) ×3 IMPLANT
ELECT MENISCUS 165MM 90D (ELECTRODE) ×3 IMPLANT
ELECT REM PT RETURN 9FT ADLT (ELECTROSURGICAL) ×3
ELECTRODE REM PT RTRN 9FT ADLT (ELECTROSURGICAL) ×1 IMPLANT
GAUZE SPONGE 4X4 12PLY STRL (GAUZE/BANDAGES/DRESSINGS) ×4 IMPLANT
GAUZE XEROFORM 1X8 LF (GAUZE/BANDAGES/DRESSINGS) ×3 IMPLANT
GLOVE BIOGEL PI IND STRL 7.0 (GLOVE) ×1 IMPLANT
GLOVE BIOGEL PI INDICATOR 7.0 (GLOVE) ×6
GLOVE ECLIPSE 7.0 STRL STRAW (GLOVE) ×1 IMPLANT
GLOVE SURG ORTHO 8.0 STRL STRW (GLOVE) ×1 IMPLANT
GLOVE SURG SS PI 7.0 STRL IVOR (GLOVE) ×2 IMPLANT
GLOVE SURG SS PI 8.0 STRL IVOR (GLOVE) ×2 IMPLANT
GOWN STRL REUS W/ TWL LRG LVL3 (GOWN DISPOSABLE) ×2 IMPLANT
GOWN STRL REUS W/ TWL XL LVL3 (GOWN DISPOSABLE) ×1 IMPLANT
GOWN STRL REUS W/TWL LRG LVL3 (GOWN DISPOSABLE) ×3
GOWN STRL REUS W/TWL XL LVL3 (GOWN DISPOSABLE) ×6
IV NS IRRIG 3000ML ARTHROMATIC (IV SOLUTION) ×16 IMPLANT
MANIFOLD NEPTUNE II (INSTRUMENTS) ×3 IMPLANT
NDL SCORPION MULTI FIRE (NEEDLE) IMPLANT
NDL SUT 6 .5 CRC .975X.05 MAYO (NEEDLE) IMPLANT
NEEDLE MAYO TAPER (NEEDLE)
NEEDLE SCORPION MULTI FIRE (NEEDLE) IMPLANT
NS IRRIG 1000ML POUR BTL (IV SOLUTION) IMPLANT
PACK ARTHROSCOPY DSU (CUSTOM PROCEDURE TRAY) ×3 IMPLANT
PASSER SUT SWANSON 36MM LOOP (INSTRUMENTS) IMPLANT
PENCIL BUTTON HOLSTER BLD 10FT (ELECTRODE) ×3 IMPLANT
SET ARTHROSCOPY TUBING (MISCELLANEOUS) ×3
SET ARTHROSCOPY TUBING LN (MISCELLANEOUS) ×1 IMPLANT
SLEEVE SCD COMPRESS KNEE MED (MISCELLANEOUS) IMPLANT
SLING ARM FOAM STRAP LRG (SOFTGOODS) ×2 IMPLANT
SLING ARM IMMOBILIZER LRG (SOFTGOODS) IMPLANT
SLING ARM IMMOBILIZER MED (SOFTGOODS) IMPLANT
SLING ARM MED ADULT FOAM STRAP (SOFTGOODS) IMPLANT
SLING ARM XL FOAM STRAP (SOFTGOODS) IMPLANT
SPONGE LAP 4X18 X RAY DECT (DISPOSABLE) IMPLANT
STRIP CLOSURE SKIN 1/2X4 (GAUZE/BANDAGES/DRESSINGS) IMPLANT
SUCTION FRAZIER HANDLE 10FR (MISCELLANEOUS)
SUCTION TUBE FRAZIER 10FR DISP (MISCELLANEOUS) IMPLANT
SUT ETHIBOND 2 OS 4 DA (SUTURE) IMPLANT
SUT ETHILON 2 0 FS 18 (SUTURE) IMPLANT
SUT ETHILON 3 0 PS 1 (SUTURE) IMPLANT
SUT FIBERWIRE #2 38 T-5 BLUE (SUTURE)
SUT RETRIEVER MED (INSTRUMENTS) IMPLANT
SUT TIGER TAPE 7 IN WHITE (SUTURE) IMPLANT
SUT VIC AB 0 CT1 27 (SUTURE)
SUT VIC AB 0 CT1 27XBRD ANBCTR (SUTURE) IMPLANT
SUT VIC AB 2-0 SH 27 (SUTURE)
SUT VIC AB 2-0 SH 27XBRD (SUTURE) IMPLANT
SUT VIC AB 3-0 FS2 27 (SUTURE) IMPLANT
SUTURE FIBERWR #2 38 T-5 BLUE (SUTURE) IMPLANT
TAPE FIBER 2MM 7IN #2 BLUE (SUTURE) IMPLANT
TOWEL OR 17X24 6PK STRL BLUE (TOWEL DISPOSABLE) ×3 IMPLANT
TOWEL OR NON WOVEN STRL DISP B (DISPOSABLE) ×3 IMPLANT
WATER STERILE IRR 1000ML POUR (IV SOLUTION) ×3 IMPLANT
YANKAUER SUCT BULB TIP NO VENT (SUCTIONS) IMPLANT

## 2015-07-18 NOTE — Transfer of Care (Signed)
Immediate Anesthesia Transfer of Care Note  Patient: Jasmine Spencer  Procedure(s) Performed: Procedure(s) with comments: RIGHT SHOULDER SCOPE WITH DEBRIDEMENT, DISTAL CLAVICAL EXCISION, ACROMIOPLASTY (Right) - ANESTHESIA: GENERAL, PRE/POST OF SCALENE  Patient Location: PACU  Anesthesia Type:General  Level of Consciousness: awake and sedated  Airway & Oxygen Therapy: Patient Spontanous Breathing and Patient connected to face mask oxygen  Post-op Assessment: Report given to RN and Post -op Vital signs reviewed and stable  Post vital signs: Reviewed and stable  Last Vitals:  Filed Vitals:   07/18/15 0712 07/18/15 0713  BP:    Pulse: 76 78  Temp:    Resp: 19 16    Last Pain: There were no vitals filed for this visit.    Patients Stated Pain Goal: 0 ([phone] [phone])  Complications: No apparent anesthesia complications

## 2015-07-18 NOTE — Anesthesia Postprocedure Evaluation (Signed)
Anesthesia Post Note  Patient: Yzabel Majure Janelle  Procedure(s) Performed: Procedure(s) (LRB): RIGHT SHOULDER SCOPE WITH DEBRIDEMENT, DISTAL CLAVICAL EXCISION, ACROMIOPLASTY (Right)  Patient location during evaluation: PACU Anesthesia Type: General and Regional Level of consciousness: awake and alert Pain management: pain level controlled Vital Signs Assessment: post-procedure vital signs reviewed and stable Respiratory status: spontaneous breathing, nonlabored ventilation, respiratory function stable and patient connected to nasal cannula oxygen Cardiovascular status: blood pressure returned to baseline and stable Postop Assessment: no signs of nausea or vomiting Anesthetic complications: no    Last Vitals:  Filed Vitals:   07/18/15 1000 07/18/15 1015  BP: 107/67 105/62  Pulse: 72 71  Temp:    Resp: 24 11    Last Pain:  Filed Vitals:   07/18/15 1021  PainSc: 0-No pain                 Effie Berkshire

## 2015-07-18 NOTE — Anesthesia Procedure Notes (Addendum)
Anesthesia Regional Block:  Interscalene brachial plexus block  Pre-Anesthetic Checklist: ,, timeout performed, Correct Patient, Correct Site, Correct Laterality, Correct Procedure, Correct Position, site marked, Risks and benefits discussed,  Surgical consent,  Pre-op evaluation,  At surgeon's request and post-op pain management  Laterality: Right  Prep: chloraprep       Needles:  Injection technique: Single-shot  Needle Type: Echogenic Needle     Needle Length: 9cm 9 cm Needle Gauge: 21 and 21 G    Additional Needles:  Procedures: ultrasound guided (picture in chart) Interscalene brachial plexus block Narrative:  Start time: 07/18/2015 7:05 AM End time: 07/18/2015 7:08 AM Injection made incrementally with aspirations every 5 mL.  Performed by: Personally  Anesthesiologist: Suella Broad D  Additional Notes: No immediate complications noted. Pt tolerated well.    Procedure Name: Intubation Performed by: Terrance Mass Pre-anesthesia Checklist: Patient identified, Emergency Drugs available, Suction available and Patient being monitored Patient Re-evaluated:Patient Re-evaluated prior to inductionOxygen Delivery Method: Circle System Utilized Preoxygenation: Pre-oxygenation with 100% oxygen Intubation Type: IV induction Ventilation: Mask ventilation without difficulty Grade View: Grade II Tube type: Oral Tube size: 7.0 mm Number of attempts: 1 Airway Equipment and Method: Stylet and Oral airway Placement Confirmation: ETT inserted through vocal cords under direct vision,  positive ETCO2 and breath sounds checked- equal and bilateral Secured at: 22 cm Tube secured with: Tape Dental Injury: Teeth and Oropharynx as per pre-operative assessment

## 2015-07-18 NOTE — Anesthesia Preprocedure Evaluation (Addendum)
Anesthesia Evaluation  Patient identified by MRN, date of birth, ID band Patient awake    Reviewed: Allergy & Precautions, NPO status , Patient's Chart, lab work & pertinent test results  Airway Mallampati: II   Neck ROM: Full  Mouth opening: Limited Mouth Opening  Dental  (+) Teeth Intact, Dental Advisory Given   Pulmonary    breath sounds clear to auscultation       Cardiovascular negative cardio ROS   Rhythm:Regular Rate:Normal     Neuro/Psych  Headaches, negative psych ROS   GI/Hepatic Neg liver ROS, hiatal hernia,   Endo/Other  negative endocrine ROS  Renal/GU negative Renal ROS  negative genitourinary   Musculoskeletal  (+) Arthritis ,   Abdominal   Peds negative pediatric ROS (+)  Hematology negative hematology ROS (+)   Anesthesia Other Findings   Reproductive/Obstetrics negative OB ROS                            Lab Results  Component Value Date   WBC 7.5 03/13/2015   HGB 13.6 03/13/2015   HCT 38.9 03/13/2015   MCV 86.6 03/13/2015   PLT 286 03/13/2015   Lab Results  Component Value Date   CREATININE 0.64 03/13/2015   BUN 11 03/13/2015   NA 138 03/13/2015   K 3.9 03/13/2015   CL 102 03/13/2015   CO2 27 03/13/2015   Lab Results  Component Value Date   INR 1.86* 10/03/2011   INR 1.10 10/02/2011   INR 1.13 10/01/2011     Anesthesia Physical Anesthesia Plan  ASA: III  Anesthesia Plan: General   Post-op Pain Management: GA combined w/ Regional for post-op pain   Induction: Intravenous  Airway Management Planned: Oral ETT  Additional Equipment:   Intra-op Plan:   Post-operative Plan: Extubation in OR  Informed Consent: I have reviewed the patients History and Physical, chart, labs and discussed the procedure including the risks, benefits and alternatives for the proposed anesthesia with the patient or authorized representative who has indicated his/her  understanding and acceptance.   Dental advisory given  Plan Discussed with: CRNA  Anesthesia Plan Comments:         Anesthesia Quick Evaluation

## 2015-07-18 NOTE — Op Note (Signed)
Jasmine Spencer, Jasmine Spencer NO.:  [id number]  MEDICAL RECORD NO.:  [id number]  LOCATION:                                 FACILITY:  PHYSICIAN:  Ninetta Lights, M.D. DATE OF BIRTH:  [birth year]  DATE OF PROCEDURE:  07/18/2015 DATE OF DISCHARGE:                              OPERATIVE REPORT   PREOPERATIVE DIAGNOSES:  Right shoulder chronic impingement, labral tear.  Partial thickness tearing, rotator cuff.  Degenerative joint disease, acromioclavicular joint.  POSTOPERATIVE DIAGNOSES:  Right shoulder chronic impingement, labral tear.  Partial thickness tearing, rotator cuff.  Degenerative joint disease, acromioclavicular joint with considerable partial abrasive tearing, rotator cuff, supraspinatus, infraspinatus and subscap. Nothing full-thickness.  Grade 4 chondral lesion, superior humeral head with some chondral loose bodies.  PROCEDURES:  Right shoulder exam under anesthesia, arthroscopy. Debridement of circumferential labral joints.  Debridement of the rotator cuff above and below.  Chondroplasty of humeral head.  Removal of loose bodies.  Bursectomy, acromioplasty, coracoacromial ligament release.  Excision of distal clavicle.  Fluoroscopic guidance.  SURGEON:  Ninetta Lights, M.D.  ASSISTANT:  Elmyra Ricks, PA, present throughout the entire case and necessary for timely completion of procedure.  ANESTHESIA:  General.  BLOOD LOSS:  Minimal.  SPECIMENS:  None.  CULTURES:  None.  COMPLICATIONS:  None.  DRESSINGS:  Soft compressive with sling.  DESCRIPTION OF PROCEDURE:  The patient was brought to the operating room and after adequate anesthesia had been obtained, shoulder examined. Full motion with stable shoulder.  Placed in beach-chair position. Shoulder positioner, prepped and draped in usual sterile fashion.  Three portals; anterior, posterior and lateral.  Arthroscope was introduced, shoulder was distended and inspected.  Considerable  circumferential tearing labelled.  Debrided back to stable surface.  Some mild changes on the biceps exiting the shoulder, but the biceps tendon and anchor still intact.  Grade 4 lesion on the top of humeral head.  Posterior supraspinatus, infraspinatus region debrided.  Although this was grade 4, it was very marginal, remaining articular cartilage looked good. Rotator cuff debrided including subscap.  Cannula redirected subacromially.  Reactive bursitis.  Cuff debrided.  Nothing full- thickness.  Acromioplasty from type 2 to type 1 acromion and releasing CA ligament.  Marked spurring, grade 4 changes clavicle.  Periarticular spurs, lateral centimeter of clavicle resected.  As this was a considerable resection with large changes, I used fluoroscopy to confirm adequate excision.  Once that was confirmed, adequacy of decompression confirmed arthroscopically.  Instruments and fluid were removed. Portals were closed with nylon.  Sterile compressive dressing applied. Sling applied.  Anesthesia reversed.  Brought to the recovery room. Tolerated the surgery well.  No complications.     Ninetta Lights, M.D.   ______________________________ Ninetta Lights, M.D.    DFM/MEDQ  D:  07/18/2015  T:  07/18/2015  Job:  [id number]

## 2015-07-18 NOTE — Discharge Instructions (Signed)
Shouder arthroscopy, partial rotator cuff tear debridement subacromial decompression °Care After Instructions °Refer to this sheet in the next few weeks. These discharge instructions provide you with general information on caring for yourself after you leave the hospital. Your caregiver may also give you specific instructions. Your treatment has been planned according to the most current medical practices available, but unavoidable complications sometimes occur. If you have any problems or questions after discharge, please call your caregiver. °HOME INSTRUCTIONS °You may resume a normal diet and activities as directed. Take showers instead of baths until informed otherwise.  °Change bandages (dressings) in 3 days.  Swab wounds daily with betadine.  Wash shoulder with soap and water.  Pat dry.  Cover wounds with bandaids. °Only take over-the-counter or prescription medicines for pain, discomfort, or fever as directed by your caregiver.  °Wear your sling for the next 2 days unless otherwise instructed. °Eat a well-balanced diet.  °Avoid lifting or driving until you are instructed otherwise.  °Make an appointment to see your caregiver for stitches (suture) or staple removal one week after surgery. ° °SEEK MEDICAL CARE IF: °You have swelling of your calf or leg.  °You develop shortness of breath or chest pain.  °You have redness, swelling, or increasing pain in the wound.  °There is pus or any unusual drainage coming from the surgical site.  °You notice a bad smell coming from the surgical site or dressing.  °The surgical site breaks open after sutures or staples have been removed.  °There is persistent bleeding from the suture or staple line.  °You are getting worse or are not improving.  °You have any other questions or concerns.  °SEEK IMMEDIATE MEDICAL CARE IF:  °You have a fever greater than 101 °You develop a rash.  °You have difficulty breathing.  °You develop any reaction or side effects to medicines given.    °Your knee motion is decreasing rather than improving.  °MAKE SURE YOU:  °Understand these instructions.  °Will watch your condition.  °Will get help right away if you are not doing well or get worse.  ° °Post Anesthesia Home Care Instructions ° °Activity: °Get plenty of rest for the remainder of the day. A responsible adult should stay with you for 24 hours following the procedure.  °For the next 24 hours, DO NOT: °-Drive a car °-Operate machinery °-Drink alcoholic beverages °-Take any medication unless instructed by your physician °-Make any legal decisions or sign important papers. ° °Meals: °Start with liquid foods such as gelatin or soup. Progress to regular foods as tolerated. Avoid greasy, spicy, heavy foods. If nausea and/or vomiting occur, drink only clear liquids until the nausea and/or vomiting subsides. Call your physician if vomiting continues. ° °Special Instructions/Symptoms: °Your throat may feel dry or sore from the anesthesia or the breathing tube placed in your throat during surgery. If this causes discomfort, gargle with warm salt water. The discomfort should disappear within 24 hours. ° °If you had a scopolamine patch placed behind your ear for the management of post- operative nausea and/or vomiting: ° °1. The medication in the patch is effective for 72 hours, after which it should be removed.  Wrap patch in a tissue and discard in the trash. Wash hands thoroughly with soap and water. °2. You may remove the patch earlier than 72 hours if you experience unpleasant side effects which may include dry mouth, dizziness or visual disturbances. °3. Avoid touching the patch. Wash your hands with soap and water after contact with   the patch. ° ° °  °Regional Anesthesia Blocks ° °1. Numbness or the inability to move the "blocked" extremity may last from 3-48 hours after placement. The length of time depends on the medication injected and your individual response to the medication. If the numbness is not  going away after 48 hours, call your surgeon. ° °2. The extremity that is blocked will need to be protected until the numbness is gone and the  Strength has returned. Because you cannot feel it, you will need to take extra care to avoid injury. Because it may be weak, you may have difficulty moving it or using it. You may not know what position it is in without looking at it while the block is in effect. ° °3. For blocks in the legs and feet, returning to weight bearing and walking needs to be done carefully. You will need to wait until the numbness is entirely gone and the strength has returned. You should be able to move your leg and foot normally before you try and bear weight or walk. You will need someone to be with you when you first try to ensure you do not fall and possibly risk injury. ° °4. Bruising and tenderness at the needle site are common side effects and will resolve in a few days. ° °5. Persistent numbness or new problems with movement should be communicated to the surgeon or the [facility] ([phone])/ [facility] ([phone]). °

## 2015-07-18 NOTE — Progress Notes (Signed)
AssistedDr. Hollis with right, ultrasound guided, interscalene  block. Side rails up, monitors on throughout procedure. See vital signs in flow sheet. Tolerated Procedure well.  

## 2015-07-18 NOTE — Interval H&P Note (Signed)
History and Physical Interval Note:  07/18/2015 7:23 AM  Jasmine Spencer  has presented today for surgery, with the diagnosis of [place], PRIMARY OSTEOARTHRITIS RIGHT SHOULDER, IMPINGEMENT SYNDROME OF RIGHT SHOULDER, BICIPITAL TENDINITIS RIGHT SHOULDER, STRAIN OF MUSCLES  The various methods of treatment have been discussed with the patient and family. After consideration of risks, benefits and other options for treatment, the patient has consented to  Procedure(s) with comments: RIGHT SHOULDER SCOPE DEBRIDEMENT, DISTAL CLAVICAL EXCISION, ACROMIOPLASTY, ROTATOR CUFF REPAIR AND BICEP TENODESIS (Right) - ANESTHESIA: GENERAL, PRE/POST OF SCALENE as a surgical intervention .  The patient's history has been reviewed, patient examined, no change in status, stable for surgery.  I have reviewed the patient's chart and labs.  Questions were answered to the patient's satisfaction.     Jasmine Spencer

## 2016-03-24 ENCOUNTER — Other Ambulatory Visit: Payer: Self-pay | Admitting: Women's Health

## 2016-03-24 DIAGNOSIS — Z1231 Encounter for screening mammogram for malignant neoplasm of breast: Secondary | ICD-10-CM

## 2016-05-04 ENCOUNTER — Ambulatory Visit: Payer: BC Managed Care – PPO

## 2016-05-12 ENCOUNTER — Telehealth: Payer: Self-pay | Admitting: *Deleted

## 2016-05-12 MED ORDER — VALACYCLOVIR HCL 500 MG PO TABS
ORAL_TABLET | ORAL | 6 refills | Status: DC
Start: 2016-05-12 — End: 2016-09-16

## 2016-05-12 NOTE — Telephone Encounter (Signed)
Left on voicemail Rx has been sent.

## 2016-05-12 NOTE — Telephone Encounter (Signed)
Pt called requesting Rx for valtrex for cold sores, has very rare outbreaks, states day 3 of new cold sore. Please advise

## 2016-05-12 NOTE — Telephone Encounter (Signed)
Valtrex 500 mg po bid for 3-5 days, # 30 with 6 refills.  Tell her to take 2 tablets twice a day since starting after outbreak.

## 2016-05-25 ENCOUNTER — Ambulatory Visit
Admission: RE | Admit: 2016-05-25 | Discharge: 2016-05-25 | Disposition: A | Discharge status: Home / Self Care | Payer: BC Managed Care – PPO | Source: Ambulatory Visit | Attending: Women's Health | Admitting: Women's Health

## 2016-05-25 DIAGNOSIS — Z1231 Encounter for screening mammogram for malignant neoplasm of breast: Secondary | ICD-10-CM

## 2016-05-27 ENCOUNTER — Other Ambulatory Visit: Payer: Self-pay | Admitting: Women's Health

## 2016-05-27 DIAGNOSIS — R928 Other abnormal and inconclusive findings on diagnostic imaging of breast: Secondary | ICD-10-CM

## 2016-05-29 ENCOUNTER — Ambulatory Visit
Admission: RE | Admit: 2016-05-29 | Discharge: 2016-05-29 | Disposition: A | Discharge status: Home / Self Care | Payer: BC Managed Care – PPO | Source: Ambulatory Visit | Attending: Women's Health | Admitting: Women's Health

## 2016-05-29 DIAGNOSIS — R928 Other abnormal and inconclusive findings on diagnostic imaging of breast: Secondary | ICD-10-CM

## 2016-05-30 ENCOUNTER — Encounter: Payer: Self-pay | Admitting: Women's Health

## 2016-09-16 ENCOUNTER — Encounter: Payer: Self-pay | Admitting: Women's Health

## 2016-09-16 ENCOUNTER — Ambulatory Visit (INDEPENDENT_AMBULATORY_CARE_PROVIDER_SITE_OTHER): Payer: BC Managed Care – PPO | Admitting: Women's Health

## 2016-09-16 ENCOUNTER — Other Ambulatory Visit: Payer: Self-pay | Admitting: Women's Health

## 2016-09-16 VITALS — BP 120/76 | Ht 63.5 in | Wt 208.0 lb

## 2016-09-16 DIAGNOSIS — B009 Herpesviral infection, unspecified: Secondary | ICD-10-CM | POA: Diagnosis not present

## 2016-09-16 DIAGNOSIS — R350 Frequency of micturition: Secondary | ICD-10-CM | POA: Diagnosis not present

## 2016-09-16 DIAGNOSIS — Z01419 Encounter for gynecological examination (general) (routine) without abnormal findings: Secondary | ICD-10-CM

## 2016-09-16 DIAGNOSIS — Z1151 Encounter for screening for human papillomavirus (HPV): Secondary | ICD-10-CM | POA: Diagnosis not present

## 2016-09-16 DIAGNOSIS — Z13220 Encounter for screening for lipoid disorders: Secondary | ICD-10-CM | POA: Diagnosis not present

## 2016-09-16 LAB — CBC WITH DIFFERENTIAL/PLATELET
BASOS ABS: 0 {cells}/uL (ref 0–200)
Basophils Relative: 0 %
EOS PCT: 3 %
Eosinophils Absolute: 219 cells/uL (ref 15–500)
HCT: 41.1 % (ref 35.0–45.0)
HEMOGLOBIN: 14 g/dL (ref 11.7–15.5)
LYMPHS ABS: 2190 {cells}/uL (ref 850–3900)
Lymphocytes Relative: 30 %
MCH: 29.6 pg (ref 27.0–33.0)
MCHC: 34.1 g/dL (ref 32.0–36.0)
MCV: 86.9 fL (ref 80.0–100.0)
MONOS PCT: 7 %
MPV: 9.1 fL (ref 7.5–12.5)
Monocytes Absolute: 511 cells/uL (ref 200–950)
NEUTROS ABS: 4380 {cells}/uL (ref 1500–7800)
NEUTROS PCT: 60 %
PLATELETS: 283 10*3/uL (ref 140–400)
RBC: 4.73 MIL/uL (ref 3.80–5.10)
RDW: 13.2 % (ref 11.0–15.0)
WBC: 7.3 10*3/uL (ref 3.8–10.8)

## 2016-09-16 LAB — COMPREHENSIVE METABOLIC PANEL
ALBUMIN: 4.3 g/dL (ref 3.6–5.1)
ALT: 12 U/L (ref 6–29)
AST: 12 U/L (ref 10–35)
Alkaline Phosphatase: 72 U/L (ref 33–115)
BILIRUBIN TOTAL: 0.6 mg/dL (ref 0.2–1.2)
BUN: 10 mg/dL (ref 7–25)
CHLORIDE: 101 mmol/L (ref 98–110)
CO2: 21 mmol/L (ref 20–31)
CREATININE: 0.7 mg/dL (ref 0.50–1.10)
Calcium: 9.2 mg/dL (ref 8.6–10.2)
GLUCOSE: 83 mg/dL (ref 65–99)
Potassium: 4.1 mmol/L (ref 3.5–5.3)
SODIUM: 137 mmol/L (ref 135–146)
Total Protein: 6.8 g/dL (ref 6.1–8.1)

## 2016-09-16 LAB — LIPID PANEL
Cholesterol: 224 mg/dL — ABNORMAL HIGH (ref <200)
HDL: 49 mg/dL — ABNORMAL LOW (ref >50)
LDL CALC: 135 mg/dL — AB (ref <100)
Total CHOL/HDL Ratio: 4.6 Ratio (ref <5.0)
Triglycerides: 198 mg/dL — ABNORMAL HIGH (ref <150)
VLDL: 40 mg/dL — ABNORMAL HIGH (ref <30)

## 2016-09-16 MED ORDER — VALACYCLOVIR HCL 500 MG PO TABS: ORAL_TABLET | ORAL | 6 refills | Status: DC

## 2016-09-16 NOTE — Progress Notes (Signed)
Jasmine Spencer 1966-02-02 [id number]    History:    Presents for annual exam.  Mostly monthly 5-7 day cycles, 03/2014 ablation. Normal Pap history. Mammogram 05/2016 normal after ultrasound has six-month follow-up.  Past medical history, past surgical history, family history and social history were all reviewed and documented in the EPIC chart. 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 Lives with her partner,and brother. Mother hypertension, melanoma and diabetes. Father hypertension deceased.  ROS:  A ROS was performed and pertinent positives and negatives are included.  Exam:  Vitals:   09/16/16 1045  BP: 120/76  Weight: 208 lb (94.3 kg)  Height: 5' 3.5" (1.613 m)   Body mass index is 36.27 kg/m.   General appearance:  Normal Thyroid:  Symmetrical, normal in size, without palpable masses or nodularity. Respiratory  Auscultation:  Clear without wheezing or rhonchi Cardiovascular  Auscultation:  Regular rate, without rubs, murmurs or gallops  Edema/varicosities:  Not grossly evident Abdominal  Soft,nontender, without masses, guarding or rebound.  Liver/spleen:  No organomegaly noted  Hernia:  None appreciated  Skin  Inspection:  Grossly normal   Breasts: Examined lying and sitting.     Right: Without masses, retractions, discharge or axillary adenopathy.     Left: Without masses, retractions, discharge or axillary adenopathy. Gentitourinary   Inguinal/mons:  Normal without inguinal adenopathy  External genitalia:  Normal  BUS/Urethra/Skene's glands:  Normal  Vagina:  Normal  Cervix:  Normal  Uterus: , normal in size, shape and contour.  Midline and mobile  Adnexa/parametria:     Rt: Without masses or tenderness.   Lt: Without masses or tenderness.  Anus and perineum: Normal  Digital rectal exam: Normal sphincter tone without palpated masses or tenderness  Assessment/Plan:  51 y.o. S [place] G2 P2 long-term lesbian  relationship  for annual exam with no complaints.  Mostly monthly cycle 03/2014 endometrial ablation Obesity HSV rare outbreaks  Plan: Menopause reviewed, instructed to call if problems with heavy bleeding. SBE's, annual screening mammogram keep scheduled six-month ultrasound follow-up. Continue active lifestyle with regular exercise, calcium rich diet, vitamin D 2000 daily encouraged. Encouraged to decrease carbs and diet for weight loss. Valtrex 500 twice daily for 3-5 days when necessary. Encouraged increased sunblock and annual dermatology skin screening.  CBC, CMP, lipid panel, UA, for urinary frequency, Pap with HR HPV typing, new screening guidelines reviewed.    [place], 12:28 PM 09/16/2016

## 2016-09-16 NOTE — Patient Instructions (Addendum)
Red rice yeast supplement  Health Maintenance, Female Adopting a healthy lifestyle and getting preventive care can go a long way to promote health and wellness. Talk with your health care provider about what schedule of regular examinations is right for you. This is a good chance for you to check in with your provider about disease prevention and staying healthy. In between checkups, there are plenty of things you can do on your own. Experts have done a lot of research about which lifestyle changes and preventive measures are most likely to keep you healthy. Ask your health care provider for more information. Weight and diet Eat a healthy diet  Be sure to include plenty of vegetables, fruits, low-fat dairy products, and lean protein.  Do not eat a lot of foods high in solid fats, added sugars, or salt.  Get regular exercise. This is one of the most important things you can do for your health. ? Most adults should exercise for at least 150 minutes each week. The exercise should increase your heart rate and make you sweat (moderate-intensity exercise). ? Most adults should also do strengthening exercises at least twice a week. This is in addition to the moderate-intensity exercise.  Maintain a healthy weight  Body mass index (BMI) is a measurement that can be used to identify possible weight problems. It estimates body fat based on height and weight. Your health care provider can help determine your BMI and help you achieve or maintain a healthy weight.  For females 54 years of age and older: ? A BMI below 18.5 is considered underweight. ? A BMI of 18.5 to 24.9 is normal. ? A BMI of 25 to 29.9 is considered overweight. ? A BMI of 30 and above is considered obese.  Watch levels of cholesterol and blood lipids  You should start having your blood tested for lipids and cholesterol at 50 years of age, then have this test every 5 years.  You may need to have your cholesterol levels checked more  often if: ? Your lipid or cholesterol levels are high. ? You are older than 50 years of age. ? You are at high risk for heart disease.  Cancer screening Lung Cancer  Lung cancer screening is recommended for adults 59-109 years old who are at high risk for lung cancer because of a history of smoking.  A yearly low-dose CT scan of the lungs is recommended for people who: ? Currently smoke. ? Have quit within the past 15 years. ? Have at least a 30-pack-year history of smoking. A pack year is smoking an average of one pack of cigarettes a day for 1 year.  Yearly screening should continue until it has been 15 years since you quit.  Yearly screening should stop if you develop a health problem that would prevent you from having lung cancer treatment.  Breast Cancer  Practice breast self-awareness. This means understanding how your breasts normally appear and feel.  It also means doing regular breast self-exams. Let your health care provider know about any changes, no matter how small.  If you are in your 20s or 30s, you should have a clinical breast exam (CBE) by a health care provider every 1-3 years as part of a regular health exam.  If you are 83 or older, have a CBE every year. Also consider having a breast X-ray (mammogram) every year.  If you have a family history of breast cancer, talk to your health care provider about genetic screening.  If  you are at high risk for breast cancer, talk to your health care provider about having an MRI and a mammogram every year.  Breast cancer gene (BRCA) assessment is recommended for women who have family members with BRCA-related cancers. BRCA-related cancers include: ? Breast. ? Ovarian. ? Tubal. ? Peritoneal cancers.  Results of the assessment will determine the need for genetic counseling and BRCA1 and BRCA2 testing.  Cervical Cancer Your health care provider may recommend that you be screened regularly for cancer of the pelvic organs  (ovaries, uterus, and vagina). This screening involves a pelvic examination, including checking for microscopic changes to the surface of your cervix (Pap test). You may be encouraged to have this screening done every 3 years, beginning at age 21.  For women ages 30-65, health care providers may recommend pelvic exams and Pap testing every 3 years, or they may recommend the Pap and pelvic exam, combined with testing for human papilloma virus (HPV), every 5 years. Some types of HPV increase your risk of cervical cancer. Testing for HPV may also be done on women of any age with unclear Pap test results.  Other health care providers may not recommend any screening for nonpregnant women who are considered low risk for pelvic cancer and who do not have symptoms. Ask your health care provider if a screening pelvic exam is right for you.  If you have had past treatment for cervical cancer or a condition that could lead to cancer, you need Pap tests and screening for cancer for at least 20 years after your treatment. If Pap tests have been discontinued, your risk factors (such as having a new sexual partner) need to be reassessed to determine if screening should resume. Some women have medical problems that increase the chance of getting cervical cancer. In these cases, your health care provider may recommend more frequent screening and Pap tests.  Colorectal Cancer  This type of cancer can be detected and often prevented.  Routine colorectal cancer screening usually begins at 50 years of age and continues through 50 years of age.  Your health care provider may recommend screening at an earlier age if you have risk factors for colon cancer.  Your health care provider may also recommend using home test kits to check for hidden blood in the stool.  A small camera at the end of a tube can be used to examine your colon directly (sigmoidoscopy or colonoscopy). This is done to check for the earliest forms of  colorectal cancer.  Routine screening usually begins at age 50.  Direct examination of the colon should be repeated every 5-10 years through 50 years of age. However, you may need to be screened more often if early forms of precancerous polyps or small growths are found.  Skin Cancer  Check your skin from head to toe regularly.  Tell your health care provider about any new moles or changes in moles, especially if there is a change in a mole's shape or color.  Also tell your health care provider if you have a mole that is larger than the size of a pencil eraser.  Always use sunscreen. Apply sunscreen liberally and repeatedly throughout the day.  Protect yourself by wearing long sleeves, pants, a wide-brimmed hat, and sunglasses whenever you are outside.  Heart disease, diabetes, and high blood pressure  High blood pressure causes heart disease and increases the risk of stroke. High blood pressure is more likely to develop in: ? People who have blood pressure   in the high end of the normal range (130-139/85-89 mm Hg). ? People who are overweight or obese. ? People who are African American.  If you are 28-40 years of age, have your blood pressure checked every 3-5 years. If you are 66 years of age or older, have your blood pressure checked every year. You should have your blood pressure measured twice-once when you are at a hospital or clinic, and once when you are not at a hospital or clinic. Record the average of the two measurements. To check your blood pressure when you are not at a hospital or clinic, you can use: ? An automated blood pressure machine at a pharmacy. ? A home blood pressure monitor.  If you are between 22 years and 24 years old, ask your health care provider if you should take aspirin to prevent strokes.  Have regular diabetes screenings. This involves taking a blood sample to check your fasting blood sugar level. ? If you are at a normal weight and have a low risk  for diabetes, have this test once every three years after 50 years of age. ? If you are overweight and have a high risk for diabetes, consider being tested at a younger age or more often. Preventing infection Hepatitis B  If you have a higher risk for hepatitis B, you should be screened for this virus. You are considered at high risk for hepatitis B if: ? You were born in a country where hepatitis B is common. Ask your health care provider which countries are considered high risk. ? Your parents were born in a high-risk country, and you have not been immunized against hepatitis B (hepatitis B vaccine). ? You have HIV or AIDS. ? You use needles to inject street drugs. ? You live with someone who has hepatitis B. ? You have had sex with someone who has hepatitis B. ? You get hemodialysis treatment. ? You take certain medicines for conditions, including cancer, organ transplantation, and autoimmune conditions.  Hepatitis C  Blood testing is recommended for: ? Everyone born from [birth year] through 1964. ? Anyone with known risk factors for hepatitis C.  Sexually transmitted infections (STIs)  You should be screened for sexually transmitted infections (STIs) including gonorrhea and chlamydia if: ? You are sexually active and are younger than 50 years of age. ? You are older than 50 years of age and your health care provider tells you that you are at risk for this type of infection. ? Your sexual activity has changed since you were last screened and you are at an increased risk for chlamydia or gonorrhea. Ask your health care provider if you are at risk.  If you do not have HIV, but are at risk, it may be recommended that you take a prescription medicine daily to prevent HIV infection. This is called pre-exposure prophylaxis (PrEP). You are considered at risk if: ? You are sexually active and do not regularly use condoms or know the HIV status of your partner(s). ? You take drugs by  injection. ? You are sexually active with a partner who has HIV.  Talk with your health care provider about whether you are at high risk of being infected with HIV. If you choose to begin PrEP, you should first be tested for HIV. You should then be tested every 3 months for as long as you are taking PrEP. Pregnancy  If you are premenopausal and you may become pregnant, ask your health care provider about preconception counseling.  If you may become pregnant, take 400 to 800 micrograms (mcg) of folic acid every day.  If you want to prevent pregnancy, talk to your health care provider about birth control (contraception). Osteoporosis and menopause  Osteoporosis is a disease in which the bones lose minerals and strength with aging. This can result in serious bone fractures. Your risk for osteoporosis can be identified using a bone density scan.  If you are 17 years of age or older, or if you are at risk for osteoporosis and fractures, ask your health care provider if you should be screened.  Ask your health care provider whether you should take a calcium or vitamin D supplement to lower your risk for osteoporosis.  Menopause may have certain physical symptoms and risks.  Hormone replacement therapy may reduce some of these symptoms and risks. Talk to your health care provider about whether hormone replacement therapy is right for you. Follow these instructions at home:  Schedule regular health, dental, and eye exams.  Stay current with your immunizations.  Do not use any tobacco products including cigarettes, chewing tobacco, or electronic cigarettes.  If you are pregnant, do not drink alcohol.  If you are breastfeeding, limit how much and how often you drink alcohol.  Limit alcohol intake to no more than 1 drink per day for nonpregnant women. One drink equals 12 ounces of beer, 5 ounces of wine, or 1 ounces of hard liquor.  Do not use street drugs.  Do not share needles.  Ask  your health care provider for help if you need support or information about quitting drugs.  Tell your health care provider if you often feel depressed.  Tell your health care provider if you have ever been abused or do not feel safe at home. This information is not intended to replace advice given to you by your health care provider. Make sure you discuss any questions you have with your health care provider. Document Released: 08/25/2010 Document Revised: 07/18/2015 Document Reviewed: 11/13/2014 Elsevier Interactive Patient Education  [employer].

## 2016-09-17 LAB — URINALYSIS W MICROSCOPIC + REFLEX CULTURE
Bacteria, UA: NONE SEEN [HPF]
Bilirubin Urine: NEGATIVE
CASTS: NONE SEEN [LPF]
Crystals: NONE SEEN [HPF]
GLUCOSE, UA: NEGATIVE
HGB URINE DIPSTICK: NEGATIVE
Ketones, ur: NEGATIVE
LEUKOCYTES UA: NEGATIVE
NITRITE: NEGATIVE
PH: 7.5 (ref 5.0–8.0)
Protein, ur: NEGATIVE
SPECIFIC GRAVITY, URINE: 1.01 (ref 1.001–1.035)
WBC UA: NONE SEEN WBC/HPF (ref <=5)
Yeast: NONE SEEN [HPF]

## 2016-09-18 LAB — PAP, TP IMAGING W/ HPV RNA, RFLX HPV TYPE 16,18/45: HPV mRNA, High Risk: NOT DETECTED

## 2016-09-19 LAB — URINE CULTURE

## 2016-09-23 ENCOUNTER — Other Ambulatory Visit: Payer: Self-pay | Admitting: Women's Health

## 2016-09-23 MED ORDER — SULFAMETHOXAZOLE-TRIMETHOPRIM 800-160 MG PO TABS: 1.0000 | ORAL_TABLET | Freq: Two times a day (BID) | ORAL | 0 refills | Status: DC

## 2016-11-05 ENCOUNTER — Other Ambulatory Visit: Payer: Self-pay | Admitting: Women's Health

## 2016-11-05 DIAGNOSIS — N63 Unspecified lump in unspecified breast: Secondary | ICD-10-CM

## 2016-11-11 ENCOUNTER — Ambulatory Visit
Admission: RE | Admit: 2016-11-11 | Discharge: 2016-11-11 | Disposition: A | Discharge status: Home / Self Care | Payer: BC Managed Care – PPO | Source: Ambulatory Visit | Attending: Women's Health | Admitting: Women's Health

## 2016-11-11 ENCOUNTER — Ambulatory Visit: Payer: BC Managed Care – PPO

## 2016-11-11 DIAGNOSIS — N63 Unspecified lump in unspecified breast: Secondary | ICD-10-CM

## 2016-12-18 ENCOUNTER — Ambulatory Visit (INDEPENDENT_AMBULATORY_CARE_PROVIDER_SITE_OTHER): Payer: BC Managed Care – PPO | Admitting: Obstetrics & Gynecology

## 2016-12-18 ENCOUNTER — Encounter: Payer: Self-pay | Admitting: Obstetrics & Gynecology

## 2016-12-18 VITALS — BP 130/86

## 2016-12-18 DIAGNOSIS — Z23 Encounter for immunization: Secondary | ICD-10-CM

## 2016-12-18 DIAGNOSIS — R350 Frequency of micturition: Secondary | ICD-10-CM

## 2016-12-18 MED ORDER — CIPROFLOXACIN HCL 250 MG PO TABS: 250.0000 mg | ORAL_TABLET | Freq: Two times a day (BID) | ORAL | 0 refills | Status: AC

## 2016-12-18 NOTE — Progress Notes (Signed)
    Jasmine Spencer 1965-12-07 [id number]        51 y.o.  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  No fever.    Past medical history,surgical history, problem list, medications, allergies, family history and social history were all reviewed and documented in the EPIC chart.  Directed ROS with pertinent positives and negatives documented in the history of present illness/assessment and plan.  Exam:  Vitals:   12/18/16 1605  BP: 130/86   General appearance:  Normal  CVAT neg bilaterally Tender at suprapubic area  U/A Hb 1+.  Negative otherwise.   Assessment/Plan:  51 y.o. G2P2002   1. Urinary frequency U/A Hb 1+ otherwise neg.  Per patient, typical Sxs of her recurrent cystitis.  Therefore, Urine sent for culture and decision to start on Ciprofloxacin 250 BID x 7 days. - Urine Culture  Counseling on above issues >50% x 15 minutes  Princess Bruins MD, 4:13 PM 12/18/2016

## 2016-12-19 LAB — URINALYSIS W MICROSCOPIC + REFLEX CULTURE
Bacteria, UA: NONE SEEN /HPF
Bilirubin Urine: NEGATIVE
Glucose, UA: NEGATIVE
HYALINE CAST: NONE SEEN /LPF
Ketones, ur: NEGATIVE
Leukocyte Esterase: NEGATIVE
Nitrites, Initial: NEGATIVE
PH: 5.5 (ref 5.0–8.0)
Protein, ur: NEGATIVE
SPECIFIC GRAVITY, URINE: 1.015 (ref 1.001–1.03)
WBC UA: NONE SEEN /HPF (ref 0–5)

## 2016-12-19 LAB — NO CULTURE INDICATED

## 2016-12-19 NOTE — Patient Instructions (Signed)
1. Urinary frequency U/A Hb 1+ otherwise neg.  Per patient, typical Sxs of her recurrent cystitis.  Therefore, Urine sent for culture and decision to start on Ciprofloxacin 250 BID x 7 days. - Urine Culture   Renezmae, it was a pleasure meeting you today!  I will inform you of your results as soon as available.

## 2016-12-21 LAB — URINE CULTURE
MICRO NUMBER:: 81202708
SPECIMEN QUALITY:: ADEQUATE

## 2017-05-10 ENCOUNTER — Emergency Department (HOSPITAL_COMMUNITY)
Admission: EM | Admit: 2017-05-10 | Discharge: 2017-05-10 | Disposition: A | Discharge status: Home / Self Care | Payer: BC Managed Care – PPO | Attending: Emergency Medicine | Admitting: Emergency Medicine

## 2017-05-10 ENCOUNTER — Other Ambulatory Visit: Payer: Self-pay

## 2017-05-10 ENCOUNTER — Encounter (HOSPITAL_COMMUNITY): Payer: Self-pay | Admitting: *Deleted

## 2017-05-10 ENCOUNTER — Emergency Department (HOSPITAL_COMMUNITY): Payer: BC Managed Care – PPO

## 2017-05-10 DIAGNOSIS — R109 Unspecified abdominal pain: Secondary | ICD-10-CM

## 2017-05-10 DIAGNOSIS — R1011 Right upper quadrant pain: Secondary | ICD-10-CM | POA: Diagnosis present

## 2017-05-10 DIAGNOSIS — Z91040 Latex allergy status: Secondary | ICD-10-CM | POA: Diagnosis not present

## 2017-05-10 DIAGNOSIS — Z96651 Presence of right artificial knee joint: Secondary | ICD-10-CM | POA: Diagnosis not present

## 2017-05-10 LAB — URINALYSIS, ROUTINE W REFLEX MICROSCOPIC
BACTERIA UA: NONE SEEN
BILIRUBIN URINE: NEGATIVE
Glucose, UA: NEGATIVE mg/dL
Ketones, ur: NEGATIVE mg/dL
LEUKOCYTES UA: NEGATIVE
NITRITE: NEGATIVE
PROTEIN: NEGATIVE mg/dL
Specific Gravity, Urine: 1.015 (ref 1.005–1.030)
pH: 5 (ref 5.0–8.0)

## 2017-05-10 LAB — CBC
HCT: 39 % (ref 36.0–46.0)
Hemoglobin: 13.2 g/dL (ref 12.0–15.0)
MCH: 29.7 pg (ref 26.0–34.0)
MCHC: 33.8 g/dL (ref 30.0–36.0)
MCV: 87.8 fL (ref 78.0–100.0)
PLATELETS: 291 10*3/uL (ref 150–400)
RBC: 4.44 MIL/uL (ref 3.87–5.11)
RDW: 12.6 % (ref 11.5–15.5)
WBC: 6.8 10*3/uL (ref 4.0–10.5)

## 2017-05-10 LAB — COMPREHENSIVE METABOLIC PANEL
ALT: 143 U/L — ABNORMAL HIGH (ref 14–54)
AST: 26 U/L (ref 15–41)
Albumin: 4.1 g/dL (ref 3.5–5.0)
Alkaline Phosphatase: 96 U/L (ref 38–126)
Anion gap: 8 (ref 5–15)
BUN: 11 mg/dL (ref 6–20)
CALCIUM: 9.5 mg/dL (ref 8.9–10.3)
CO2: 26 mmol/L (ref 22–32)
Chloride: 105 mmol/L (ref 101–111)
Creatinine, Ser: 0.76 mg/dL (ref 0.44–1.00)
Glucose, Bld: 101 mg/dL — ABNORMAL HIGH (ref 65–99)
Potassium: 4.1 mmol/L (ref 3.5–5.1)
Sodium: 139 mmol/L (ref 135–145)
TOTAL PROTEIN: 7 g/dL (ref 6.5–8.1)
Total Bilirubin: 0.6 mg/dL (ref 0.3–1.2)

## 2017-05-10 LAB — I-STAT BETA HCG BLOOD, ED (MC, WL, AP ONLY)

## 2017-05-10 LAB — LIPASE, BLOOD: Lipase: 39 U/L (ref 11–51)

## 2017-05-10 NOTE — ED Triage Notes (Signed)
Pt went to see her Dr for RUQ pain and emesis.  Labs drawn on Thursday resulted today and showed elevated liver enzymes.

## 2017-05-10 NOTE — Discharge Instructions (Signed)
Please read attached information. If you experience any new or worsening signs or symptoms please return to the emergency room for evaluation. Please follow-up with your primary care provider or specialist as discussed.  °

## 2017-05-10 NOTE — ED Notes (Signed)
Patient transported to Ultrasound 

## 2017-05-10 NOTE — ED Provider Notes (Signed)
[facility] EMERGENCY DEPARTMENT Provider Note   CSN: [id number] Arrival date & time: 05/10/17  1514     History   Chief Complaint Chief Complaint  Patient presents with  . Abdominal Pain    HPI ASTRA GREGG is a 52 y.o. female.  HPI   52 year old female presents today with complaints of right upper quadrant abdominal pain.  Patient notes that 5 days ago she had acute onset of right upper quadrant and epigastric abdominal pain after eating wings.  She notes that she followed up with her primary care provider due to the discomfort.  LFTs were significant elevated with a bilirubin of 0.8, AST of 729, ALT of 896, and alk phos of 140.  She was formed these lab results today and encouraged to come to the emergency room for repeat evaluation.  Patient reports a very generalized ache in the right upper quadrant, nonsevere, tolerating p.o.  No history of the same.  No lower abdominal pain.  No fever.   Past Medical History:  Diagnosis Date  . Arthritis    R knee  . Complication of anesthesia    Pt. has panicky feeling  ( feels like she can't breathe)upon awaking fr. anesth. , NEEDS TO BE SITTING UPRIGHT  . Elevated cholesterol   . Family history of anesthesia complication    pt.'s father is very agitated /w MORPHINE, can tolerate DILAUDID  . H/O hiatal hernia    h/o - several yrs. ago, took Prilosec for 1 yr., no longer a problem   . Headache(784.0)    migraine- hormone related     Patient Active Problem List   Diagnosis Date Noted  . BMI 37.0-37.9, adult 03/24/2015  . Family history of coronary artery bypass surgery 03/30/2014  . SOB (shortness of breath) 03/30/2014  . Heart palpitations 03/30/2014  . Hypercholesteremia 03/09/2012  . Hyponatremia 10/03/2011  . Chronic knee pain 06/29/2011    Past Surgical History:  Procedure Laterality Date  . ADENOIDECTOMY     as a child  . CESAREAN SECTION  2002, 2007  . ENDOMETRIAL ABLATION  04/04/2014   [facility]  . KNEE SURGERY  [phone]  . SHOULDER SURGERY  [id number]   X 2  . TOTAL KNEE ARTHROPLASTY  09/30/2011   Procedure: TOTAL KNEE ARTHROPLASTY;  Surgeon: Ninetta Lights, MD;  Location: [facility];  Service: Orthopedics;  Laterality: Right;    OB History    Gravida Para Term Preterm AB Living   _0 SAB TAB Ectopic Multiple Live Births                   Home Medications    Prior to Admission medications   Medication Sig Start Date End Date Taking? Authorizing Provider  valACYclovir (VALTREX) 500 MG tablet Take one tablet by mouth twice daily 3-5 days for onset outbreak. 09/16/16   Huel Cote, NP    Family History Family History  Problem Relation Age of Onset  . Diabetes Mother   . Hypertension Mother   . Cancer Mother        MELANOMA  . Heart disease Father   . Leukemia Father        CLL  . Heart attack Father   . Heart attack Sister   . Heart disease Sister   . Heart attack Brother   . Heart disease Brother   . Breast cancer Maternal Aunt     Social History  Social History   Tobacco Use  . Smoking status: Never Smoker  . Smokeless tobacco: Never Used  Substance Use Topics  . Alcohol use: Yes    Comment: rare  . Drug use: No     Allergies   Milk-related compounds; Penicillins; and Latex   Review of Systems Review of Systems  All other systems reviewed and are negative.    Physical Exam Updated Vital Signs BP 111/74   Pulse (!) 57   Temp 98.3 F (36.8 C) (Oral)   Resp 18   Ht _0  (1.575 m)   Wt 90.7 kg (200 lb)   LMP 05/05/2017   SpO2 98%   BMI 36.58 kg/m   Physical Exam  Constitutional: She is oriented to person, place, and time. She appears well-developed and well-nourished.  HENT:  Head: Normocephalic and atraumatic.  Eyes: Conjunctivae are normal. Pupils are equal, round, and reactive to light. Right eye exhibits no discharge. Left eye exhibits no discharge. No scleral icterus.  Neck: Normal range of motion. No  JVD present. No tracheal deviation present.  Pulmonary/Chest: Effort normal. No stridor.  Abdominal:  Abdomen soft NTTP  Neurological: She is alert and oriented to person, place, and time. Coordination normal.  Psychiatric: She has a normal mood and affect. Her behavior is normal. Judgment and thought content normal.  Nursing note and vitals reviewed.    ED Treatments / Results  Labs (all labs ordered are listed, but only abnormal results are displayed) Labs Reviewed  COMPREHENSIVE METABOLIC PANEL - Abnormal; Notable for the following components:      Result Value   Glucose, Bld 101 (*)    ALT 143 (*)    All other components within normal limits  URINALYSIS, ROUTINE W REFLEX MICROSCOPIC - Abnormal; Notable for the following components:   Hgb urine dipstick MODERATE (*)    Squamous Epithelial / LPF 0-5 (*)    All other components within normal limits  LIPASE, BLOOD  CBC  I-STAT BETA HCG BLOOD, ED ([facility], [facility], [facility] ONLY)    EKG  EKG Interpretation None       Radiology US Abdomen Limited Ruq  Result Date: 05/10/2017 CLINICAL DATA:  Abdominal pain EXAM: ULTRASOUND ABDOMEN LIMITED RIGHT UPPER QUADRANT COMPARISON:  None. FINDINGS: Gallbladder: Numerous shadowing gallstones are seen in the neck of the gallbladder. No pericholecystic fluid is identified. The single wall thickness of the gallbladder is top normal at 3 mm. No sonographic Murphy sign noted by sonographer. Common bile duct: Diameter: 2.3 mm and normal Liver: No focal lesion identified. Mild diffuse echogenicity of the liver. Portal vein is patent on color Doppler imaging with normal direction of blood flow towards the liver. IMPRESSION: 1. Uncomplicated cholelithiasis. 2. Hepatic steatosis. Electronically Signed   By: Ashley Royalty M.D.   On: 05/10/2017 21:24    Procedures Procedures (including critical care time)  Medications Ordered in ED Medications - No data to display   Initial Impression / Assessment and Plan / ED  Course  I have reviewed the triage vital signs and the nursing notes.  Pertinent labs & imaging results that were available during my care of the patient were reviewed by me and considered in my medical decision making (see chart for details).      Final Clinical Impressions(s) / ED Diagnoses   Final diagnoses:  Abdominal pain  RUQ abdominal pain   Labs: I-STAT beta-hCG, lipase, CMP, CBC  Imaging:  Consults:  Therapeutics:  Discharge Meds:   Assessment/Plan: 52 year old female  presents today with abdominal pain.  She is well-appearing in no acute distress presently.  Her symptoms are consistent with biliary colic.  Patient did have significant elevation in her LFTs which has nearly completely resolved with the exception of her ALT at 143.  Patient's ultrasound does show stones, no signs of cholecystitis or obstruction.  Patient is stable for outpatient follow-up with general surgery, she will follow-up with primary care for repeat analysis of liver function tests.  She is given strict return precautions, she verbalized understanding and agreement to today's plan had no further questions or concerns.    ED Discharge Orders    None       Francee Gentile 05/10/17 2213    Daleen Bo, MD 05/12/17 [phone]

## 2017-10-11 ENCOUNTER — Ambulatory Visit: Payer: BC Managed Care – PPO | Admitting: Women's Health

## 2017-10-11 ENCOUNTER — Encounter: Payer: Self-pay | Admitting: Women's Health

## 2017-10-11 ENCOUNTER — Other Ambulatory Visit: Payer: Self-pay | Admitting: Women's Health

## 2017-10-11 VITALS — BP 130/80 | Ht 63.0 in | Wt 210.0 lb

## 2017-10-11 DIAGNOSIS — B009 Herpesviral infection, unspecified: Secondary | ICD-10-CM

## 2017-10-11 DIAGNOSIS — N898 Other specified noninflammatory disorders of vagina: Secondary | ICD-10-CM

## 2017-10-11 DIAGNOSIS — Z01419 Encounter for gynecological examination (general) (routine) without abnormal findings: Secondary | ICD-10-CM

## 2017-10-11 DIAGNOSIS — Z1231 Encounter for screening mammogram for malignant neoplasm of breast: Secondary | ICD-10-CM

## 2017-10-11 LAB — WET PREP FOR TRICH, YEAST, CLUE

## 2017-10-11 MED ORDER — VALACYCLOVIR HCL 500 MG PO TABS: ORAL_TABLET | ORAL | 6 refills | Status: DC

## 2017-10-11 NOTE — Progress Notes (Signed)
[place] 1965-09-21 [id number]    History:    Presents for annual exam.  Regular monthly cycle.  Lesbian.  2016 endometrial ablation.  HSV 1 rare outbreaks.  Normal Pap and mammogram history.  2018 mammogram okay normal after ultrasound.  04/2017 had severe abdominal pain ultrasound confirmed gallstones.  Scheduled for knee replacement December 2019.  Past medical history, past surgical history, family history and social history were all reviewed and documented in the EPIC chart.  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  [name] 12 both doing well.  Both conceived by donor insemination.  Lives with term partner and brother who is very supportive of children.  Mother hypertension, diabetes, melanoma.  Father deceased from hypertension.    ROS:  A ROS was performed and pertinent positives and negatives are included.  Exam:  Vitals:   10/11/17 1400  BP: 130/80  Weight: 210 lb (95.3 kg)  Height: 5\' 3"  (1.6 m)   Body mass index is 37.2 kg/m.   General appearance:  Normal Thyroid:  Symmetrical, normal in size, without palpable masses or nodularity. Respiratory  Auscultation:  Clear without wheezing or rhonchi Cardiovascular  Auscultation:  Regular rate, without rubs, murmurs or gallops  Edema/varicosities:  Not grossly evident Abdominal  Soft,nontender, without masses, guarding or rebound.  Liver/spleen:  No organomegaly noted  Hernia:  None appreciated  Skin  Inspection:  Grossly normal   Breasts: Examined lying and sitting.     Right: Without masses, retractions, discharge or axillary adenopathy.     Left: Without masses, retractions, discharge or axillary adenopathy. Gentitourinary   Inguinal/mons:  Normal without inguinal adenopathy  External genitalia:  Normal  BUS/Urethra/Skene's glands:  Normal  Vagina:  Normal prep negative  Cervix:  Normal  Uterus:  normal in size, shape and contour.  Midline and mobile  Adnexa/parametria:     Rt: Without masses  or tenderness.   Lt: Without masses or tenderness.  Anus and perineum: Normal  Digital rectal exam: Normal sphincter tone without palpated masses or tenderness  Assessment/Plan:  52 y.o. S WF G2, P2  for annual exam with complaint of increased vaginal discharge.  Monthly cycle HSV 1 rare outbreaks Scheduled for knee replacement December 2019 Obesity Labs-Primary care  Plan: Menopause reviewed currently having no symptoms.  SBE's, annual screening mammogram, calcium rich foods, vitamin D 2000 daily encouraged.  Aware of need to cut calories carbs, increase exercise.  Eating colonoscopy, [facility] information given.  Valtrex 500 mg p.o. twice daily as needed prescription, proper use given and reviewed.  Reviewed normality of exam and wet prep.  Reassurance given.  Annual skin check encouraged mother history of melanoma.  Pap normal 2018, new screening guidelines reviewed.    [place], 5:15 PM 10/11/2017

## 2017-10-11 NOTE — Patient Instructions (Signed)

## 2017-11-15 ENCOUNTER — Ambulatory Visit: Payer: BC Managed Care – PPO

## 2017-11-23 ENCOUNTER — Encounter: Payer: BC Managed Care – PPO | Admitting: Women's Health

## 2018-01-03 ENCOUNTER — Ambulatory Visit
Admission: RE | Admit: 2018-01-03 | Discharge: 2018-01-03 | Disposition: A | Discharge status: Home / Self Care | Payer: BC Managed Care – PPO | Source: Ambulatory Visit | Attending: Women's Health | Admitting: Women's Health

## 2018-01-03 DIAGNOSIS — Z1231 Encounter for screening mammogram for malignant neoplasm of breast: Secondary | ICD-10-CM

## 2018-01-27 HISTORY — PX: TOTAL KNEE ARTHROPLASTY: SHX125

## 2018-01-29 ENCOUNTER — Other Ambulatory Visit: Payer: Self-pay | Admitting: Orthopedic Surgery

## 2018-03-07 ENCOUNTER — Encounter: Payer: Self-pay | Admitting: Women's Health

## 2018-03-07 ENCOUNTER — Ambulatory Visit: Payer: BC Managed Care – PPO | Admitting: Women's Health

## 2018-03-07 VITALS — BP 120/78

## 2018-03-07 DIAGNOSIS — R350 Frequency of micturition: Secondary | ICD-10-CM

## 2018-03-07 DIAGNOSIS — N3000 Acute cystitis without hematuria: Secondary | ICD-10-CM | POA: Diagnosis not present

## 2018-03-07 MED ORDER — SULFAMETHOXAZOLE-TRIMETHOPRIM 800-160 MG PO TABS: 1.0000 | ORAL_TABLET | Freq: Two times a day (BID) | ORAL | 0 refills | Status: DC

## 2018-03-07 NOTE — Progress Notes (Signed)
53 yo, G2P2 presents with right lower back pain and frequency for 3 days.  Denies burning on urination, denies pain, minimal vaginal discharge with no odor, denies fever.  History of UTIs in the past with similar symptoms, negative UAs but positive cultures.  No change in recent activity.  Reports 3-day monthly cycles, skipped a month in September.  Denies menopausal symptoms at this time.  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  Retired as a Leisure centre manager, is Tourist information centre manager.  Lesbian/same partner.  Exam: Appears well.  CVAT negative. Urinalysis: Negative leukocytes, negative nitrates, negative protein, negative glucose, negative ketones, blood trace lysed, 0-5 squamous epithelial cells, no white blood cells seen, no red blood cells seen, no bacteria present, no yeast present.  Probable UTI  Plan: Urine culture pending, will treat symptoms with Bactrim twice daily for 3 days.  Discussed UTI prevention information, encouraged drinking more water decreasing caffeine intake.  Informed patient to call back if symptoms do not resolve within a week.

## 2018-03-07 NOTE — Patient Instructions (Signed)
Urinary Tract Infection, Adult A urinary tract infection (UTI) is an infection of any part of the urinary tract. The urinary tract includes:  The kidneys.  The ureters.  The bladder.  The urethra. These organs make, store, and get rid of pee (urine) in the body. What are the causes? This is caused by germs (bacteria) in your genital area. These germs grow and cause swelling (inflammation) of your urinary tract. What increases the risk? You are more likely to develop this condition if:  You have a small, thin tube (catheter) to drain pee.  You cannot control when you pee or poop (incontinence).  You are female, and: ? You use these methods to prevent pregnancy: ? A medicine that kills sperm (spermicide). ? A device that blocks sperm (diaphragm). ? You have low levels of a female hormone (estrogen). ? You are pregnant.  You have genes that add to your risk.  You are sexually active.  You take antibiotic medicines.  You have trouble peeing because of: ? A prostate that is bigger than normal, if you are female. ? A blockage in the part of your body that drains pee from the bladder (urethra). ? A kidney stone. ? A nerve condition that affects your bladder (neurogenic bladder). ? Not getting enough to drink. ? Not peeing often enough.  You have other conditions, such as: ? Diabetes. ? A weak disease-fighting system (immune system). ? Sickle cell disease. ? Gout. ? Injury of the spine. What are the signs or symptoms? Symptoms of this condition include:  Needing to pee right away (urgently).  Peeing often.  Peeing small amounts often.  Pain or burning when peeing.  Blood in the pee.  Pee that smells bad or not like normal.  Trouble peeing.  Pee that is cloudy.  Fluid coming from the vagina, if you are female.  Pain in the belly or lower back. Other symptoms include:  Throwing up (vomiting).  No urge to eat.  Feeling mixed up (confused).  Being tired  and grouchy (irritable).  A fever.  Watery poop (diarrhea). How is this treated? This condition may be treated with:  Antibiotic medicine.  Other medicines.  Drinking enough water. Follow these instructions at home:  Medicines  Take over-the-counter and prescription medicines only as told by your doctor.  If you were prescribed an antibiotic medicine, take it as told by your doctor. Do not stop taking it even if you start to feel better. General instructions  Make sure you: ? Pee until your bladder is empty. ? Do not hold pee for a long time. ? Empty your bladder after sex. ? Wipe from front to back after pooping if you are a female. Use each tissue one time when you wipe.  Drink enough fluid to keep your pee pale yellow.  Keep all follow-up visits as told by your doctor. This is important. Contact a doctor if:  You do not get better after 1-2 days.  Your symptoms go away and then come back. Get help right away if:  You have very bad back pain.  You have very bad pain in your lower belly.  You have a fever.  You are sick to your stomach (nauseous).  You are throwing up. Summary  A urinary tract infection (UTI) is an infection of any part of the urinary tract.  This condition is caused by germs in your genital area.  There are many risk factors for a UTI. These include having a small, thin   tube to drain pee and not being able to control when you pee or poop.  Treatment includes antibiotic medicines for germs.  Drink enough fluid to keep your pee pale yellow. This information is not intended to replace advice given to you by your health care provider. Make sure you discuss any questions you have with your health care provider. Document Released: 07/29/2007 Document Revised: 08/19/2017 Document Reviewed: 08/19/2017 Elsevier Interactive Patient Education  2019 Else[employer] Inc.  

## 2018-03-08 LAB — URINALYSIS, COMPLETE W/RFL CULTURE
Bacteria, UA: NONE SEEN /HPF
Bilirubin Urine: NEGATIVE
GLUCOSE, UA: NEGATIVE
HYALINE CAST: NONE SEEN /LPF
Ketones, ur: NEGATIVE
Leukocyte Esterase: NEGATIVE
NITRITES URINE, INITIAL: NEGATIVE
PH: 6.5 (ref 5.0–8.0)
PROTEIN: NEGATIVE
RBC / HPF: NONE SEEN /HPF (ref 0–2)
SPECIFIC GRAVITY, URINE: 1.01 (ref 1.001–1.03)
WBC, UA: NONE SEEN /HPF (ref 0–5)

## 2018-03-08 LAB — NO CULTURE INDICATED

## 2018-12-13 ENCOUNTER — Other Ambulatory Visit: Payer: Self-pay

## 2018-12-14 ENCOUNTER — Encounter: Payer: Self-pay | Admitting: Women's Health

## 2018-12-14 ENCOUNTER — Ambulatory Visit: Payer: BC Managed Care – PPO | Admitting: Women's Health

## 2018-12-14 VITALS — BP 126/80 | Ht 63.0 in | Wt 212.0 lb

## 2018-12-14 DIAGNOSIS — Z01419 Encounter for gynecological examination (general) (routine) without abnormal findings: Secondary | ICD-10-CM | POA: Diagnosis not present

## 2018-12-14 DIAGNOSIS — Z13220 Encounter for screening for lipoid disorders: Secondary | ICD-10-CM | POA: Diagnosis not present

## 2018-12-14 NOTE — Progress Notes (Signed)
[place] 1965/03/24 [id number]    History:    Presents for annual exam.  Amenorrheic for one year with minimal menopausal symptoms.  Prior year rare cycles.  2016 endometrial ablation.  History of hypercholesterolemia on no medication.  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  Normal Pap and mammogram history.  2019 gallstones declined cholecystectomy at that time.  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  Past medical history, past surgical history, family history and social history were all reviewed and documented in the EPIC chart.  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  Exam:  Vitals:   12/14/18 1516  BP: 126/80  Weight: 212 lb (96.2 kg)  Height: 5\' 3"  (1.6 m)   Body mass index is 37.55 kg/m.   General appearance:  Normal Thyroid:  Symmetrical, normal in size, without palpable masses or nodularity. Respiratory  Auscultation:  Clear without wheezing or rhonchi Cardiovascular  Auscultation:  Regular rate, without rubs, murmurs or gallops  Edema/varicosities: Bilateral leg swelling left greater than right Abdominal  Soft,nontender, without masses, guarding or rebound.  Liver/spleen:  No organomegaly noted  Hernia:  None appreciated  Skin  Inspection:  Grossly normal   Breasts: Examined lying and sitting.     Right: Without masses, retractions, discharge or axillary adenopathy.     Left: Without masses, retractions, discharge or axillary adenopathy. Gentitourinary   Inguinal/mons:  Normal without inguinal adenopathy  External genitalia:  Normal  BUS/Urethra/Skene's glands:  Normal  Vagina:  Normal  Cervix:  Normal  Uterus: normal in size, shape and contour.  Midline and mobile  Adnexa/parametria:     Rt: Without masses or tenderness.   Lt: Without masses or tenderness.  Anus and  perineum: Normal  Digital rectal exam: Normal sphincter tone without palpated masses or tenderness  Assessment/Plan:  53 y.o. MWF G2 P2 donor insemination/same sex partner for annual exam with no complaints.  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  SBEs, continue annual screening mammogram, calcium rich foods, vitamin D 2000 daily encouraged.  Reviewed importance of increasing exercise and decreasing calorie/carbs.  Reviewed importance of weight loss in relationship to osteoarthritis also.  Has done weight watchers in the past and is trying to do a low-carb diet.  Pap normal 2018, new screening guidelines reviewed.  Will return to office fasting for CMP, lipid panel, CBC.    [place], 5:08 PM 12/14/2018

## 2018-12-14 NOTE — Patient Instructions (Signed)
Carbohydrate Counting for Diabetes Mellitus, Adult  Carbohydrate counting is a method of keeping track of how many carbohydrates you eat. Eating carbohydrates naturally increases the amount of sugar (glucose) in the blood. Counting how many carbohydrates you eat helps keep your blood glucose within normal limits, which helps you manage your diabetes (diabetes mellitus). It is important to know how many carbohydrates you can safely have in each meal. This is different for every person. A diet and nutrition specialist (registered dietitian) can help you make a meal plan and calculate how many carbohydrates you should have at each meal and snack. Carbohydrates are found in the following foods:  Grains, such as breads and cereals.  Dried beans and soy products.  Starchy vegetables, such as potatoes, peas, and corn.  Fruit and fruit juices.  Milk and yogurt.  Sweets and snack foods, such as cake, cookies, candy, chips, and soft drinks. How do I count carbohydrates? There are two ways to count carbohydrates in food. You can use either of the methods or a combination of both. Reading "Nutrition Facts" on packaged food The "Nutrition Facts" list is included on the labels of almost all packaged foods and beverages in the [place] It includes:  The serving size.  Information about nutrients in each serving, including the grams (g) of carbohydrate per serving. To use the "Nutrition Facts":  Decide how many servings you will have.  Multiply the number of servings by the number of carbohydrates per serving.  The resulting number is the total amount of carbohydrates that you will be having. Learning standard serving sizes of other foods When you eat carbohydrate foods that are not packaged or do not include "Nutrition Facts" on the label, you need to measure the servings in order to count the amount of carbohydrates:  Measure the foods that you will eat with a food scale or measuring cup, if needed.   Decide how many standard-size servings you will eat.  Multiply the number of servings by 15. Most carbohydrate-rich foods have about 15 g of carbohydrates per serving. ? For example, if you eat 8 oz (170 g) of strawberries, you will have eaten 2 servings and 30 g of carbohydrates (2 servings x 15 g = 30 g).  For foods that have more than one food mixed, such as soups and casseroles, you must count the carbohydrates in each food that is included. The following list contains standard serving sizes of common carbohydrate-rich foods. Each of these servings has about 15 g of carbohydrates:   hamburger bun or  English muffin.   oz (15 mL) syrup.   oz (14 g) jelly.  1 slice of bread.  1 six-inch tortilla.  3 oz (85 g) cooked rice or pasta.  4 oz (113 g) cooked dried beans.  4 oz (113 g) starchy vegetable, such as peas, corn, or potatoes.  4 oz (113 g) hot cereal.  4 oz (113 g) mashed potatoes or  of a large baked potato.  4 oz (113 g) canned or frozen fruit.  4 oz (120 mL) fruit juice.  4-6 crackers.  6 chicken nuggets.  6 oz (170 g) unsweetened dry cereal.  6 oz (170 g) plain fat-free yogurt or yogurt sweetened with artificial sweeteners.  8 oz (240 mL) milk.  8 oz (170 g) fresh fruit or one small piece of fruit.  24 oz (680 g) popped popcorn. Example of carbohydrate counting Sample meal  3 oz (85 g) chicken breast.  6 oz (170 g)   brown rice.  4 oz (113 g) corn.  8 oz (240 mL) milk.  8 oz (170 g) strawberries with sugar-free whipped topping. Carbohydrate calculation 1. Identify the foods that contain carbohydrates: ? Rice. ? Corn. ? Milk. ? Strawberries. 2. Calculate how many servings you have of each food: ? 2 servings rice. ? 1 serving corn. ? 1 serving milk. ? 1 serving strawberries. 3. Multiply each number of servings by 15 g: ? 2 servings rice x 15 g = 30 g. ? 1 serving corn x 15 g = 15 g. ? 1 serving milk x 15 g = 15 g. ? 1 serving  strawberries x 15 g = 15 g. 4. Add together all of the amounts to find the total grams of carbohydrates eaten: ? 30 g + 15 g + 15 g + 15 g = 75 g of carbohydrates total. Summary  Carbohydrate counting is a method of keeping track of how many carbohydrates you eat.  Eating carbohydrates naturally increases the amount of sugar (glucose) in the blood.  Counting how many carbohydrates you eat helps keep your blood glucose within normal limits, which helps you manage your diabetes.  A diet and nutrition specialist (registered dietitian) can help you make a meal plan and calculate how many carbohydrates you should have at each meal and snack. This information is not intended to replace advice given to you by your health care provider. Make sure you discuss any questions you have with your health care provider. Document Released: 02/09/2005 Document Revised: 09/03/2016 Document Reviewed: 07/24/2015 Elsevier Patient Education  2020 [place] Maintenance for Postmenopausal Women Menopause is a normal process in which your ability to get pregnant comes to an end. This process happens slowly over many months or years, usually between the ages of 62 and 42. Menopause is complete when you have missed your menstrual periods for 12 months. It is important to talk with your health care provider about some of the most common conditions that affect women after menopause (postmenopausal women). These include heart disease, cancer, and bone loss (osteoporosis). Adopting a healthy lifestyle and getting preventive care can help to promote your health and wellness. The actions you take can also lower your chances of developing some of these common conditions. What should I know about menopause? During menopause, you may get a number of symptoms, such as:  Hot flashes. These can be moderate or severe.  Night sweats.  Decrease in sex drive.  Mood swings.  Headaches.  Tiredness.  Irritability.   Memory problems.  Insomnia. Choosing to treat or not to treat these symptoms is a decision that you make with your health care provider. Do I need hormone replacement therapy?  Hormone replacement therapy is effective in treating symptoms that are caused by menopause, such as hot flashes and night sweats.  Hormone replacement carries certain risks, especially as you become older. If you are thinking about using estrogen or estrogen with progestin, discuss the benefits and risks with your health care provider. What is my risk for heart disease and stroke? The risk of heart disease, heart attack, and stroke increases as you age. One of the causes may be a change in the body's hormones during menopause. This can affect how your body uses dietary fats, triglycerides, and cholesterol. Heart attack and stroke are medical emergencies. There are many things that you can do to help prevent heart disease and stroke. Watch your blood pressure  High blood pressure causes heart disease and increases  the risk of stroke. This is more likely to develop in people who have high blood pressure readings, are of African descent, or are overweight.  Have your blood pressure checked: ? Every 3-5 years if you are 78-28 years of age. ? Every year if you are 52 years old or older. Eat a healthy diet   Eat a diet that includes plenty of vegetables, fruits, low-fat dairy products, and lean protein.  Do not eat a lot of foods that are high in solid fats, added sugars, or sodium. Get regular exercise Get regular exercise. This is one of the most important things you can do for your health. Most adults should:  Try to exercise for at least 150 minutes each week. The exercise should increase your heart rate and make you sweat (moderate-intensity exercise).  Try to do strengthening exercises at least twice each week. Do these in addition to the moderate-intensity exercise.  Spend less time sitting. Even light physical  activity can be beneficial. Other tips  Work with your health care provider to achieve or maintain a healthy weight.  Do not use any products that contain nicotine or tobacco, such as cigarettes, e-cigarettes, and chewing tobacco. If you need help quitting, ask your health care provider.  Know your numbers. Ask your health care provider to check your cholesterol and your blood sugar (glucose). Continue to have your blood tested as directed by your health care provider. Do I need screening for cancer? Depending on your health history and family history, you may need to have cancer screening at different stages of your life. This may include screening for:  Breast cancer.  Cervical cancer.  Lung cancer.  Colorectal cancer. What is my risk for osteoporosis? After menopause, you may be at increased risk for osteoporosis. Osteoporosis is a condition in which bone destruction happens more quickly than new bone creation. To help prevent osteoporosis or the bone fractures that can happen because of osteoporosis, you may take the following actions:  If you are 66-28 years old, get at least 1,000 mg of calcium and at least 600 mg of vitamin D per day.  If you are older than age 51 but younger than age 9, get at least 1,200 mg of calcium and at least 600 mg of vitamin D per day.  If you are older than age 32, get at least 1,200 mg of calcium and at least 800 mg of vitamin D per day. Smoking and drinking excessive alcohol increase the risk of osteoporosis. Eat foods that are rich in calcium and vitamin D, and do weight-bearing exercises several times each week as directed by your health care provider. How does menopause affect my mental health? Depression may occur at any age, but it is more common as you become older. Common symptoms of depression include:  Low or sad mood.  Changes in sleep patterns.  Changes in appetite or eating patterns.  Feeling an overall lack of motivation or  enjoyment of activities that you previously enjoyed.  Frequent crying spells. Talk with your health care provider if you think that you are experiencing depression. General instructions See your health care provider for regular wellness exams and vaccines. This may include:  Scheduling regular health, dental, and eye exams.  Getting and maintaining your vaccines. These include: ? Influenza vaccine. Get this vaccine each year before the flu season begins. ? Pneumonia vaccine. ? Shingles vaccine. ? Tetanus, diphtheria, and pertussis (Tdap) booster vaccine. Your health care provider may also recommend other  immunizations. Tell your health care provider if you have ever been abused or do not feel safe at home. Summary  Menopause is a normal process in which your ability to get pregnant comes to an end.  This condition causes hot flashes, night sweats, decreased interest in sex, mood swings, headaches, or lack of sleep.  Treatment for this condition may include hormone replacement therapy.  Take actions to keep yourself healthy, including exercising regularly, eating a healthy diet, watching your weight, and checking your blood pressure and blood sugar levels.  Get screened for cancer and depression. Make sure that you are up to date with all your vaccines. This information is not intended to replace advice given to you by your health care provider. Make sure you discuss any questions you have with your health care provider. Document Released: 04/03/2005 Document Revised: 02/02/2018 Document Reviewed: 02/02/2018 Elsevier Patient Education  2020 [employer].

## 2019-05-15 ENCOUNTER — Other Ambulatory Visit: Payer: Self-pay | Admitting: Women's Health

## 2019-05-15 DIAGNOSIS — B009 Herpesviral infection, unspecified: Secondary | ICD-10-CM

## 2019-07-12 ENCOUNTER — Telehealth: Payer: Self-pay | Admitting: *Deleted

## 2019-07-12 NOTE — Telephone Encounter (Signed)
Patient called takes Valtrex 500 mg 1 tablet by mouth twice daily for 3-5 days for onset outbreak. 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 Please advise

## 2019-07-13 ENCOUNTER — Other Ambulatory Visit: Payer: Self-pay | Admitting: Nurse Practitioner

## 2019-07-13 DIAGNOSIS — B009 Herpesviral infection, unspecified: Secondary | ICD-10-CM

## 2019-07-13 MED ORDER — VALACYCLOVIR HCL 1 G PO TABS: ORAL_TABLET | ORAL | 6 refills | Status: DC

## 2019-07-13 NOTE — Telephone Encounter (Signed)
Patient informed, Tiffany sent Rx.

## 2019-07-13 NOTE — Progress Notes (Signed)
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 Increased Valtrex to 1G twice a day for 3-5 days at sign of outbreak.

## 2019-07-13 NOTE — Telephone Encounter (Signed)
I will increase to 1000 mg twice daily for 3-5 days at sign of an outbreak.

## 2019-09-29 ENCOUNTER — Other Ambulatory Visit: Payer: Self-pay | Admitting: Internal Medicine

## 2019-09-29 ENCOUNTER — Other Ambulatory Visit: Payer: Self-pay | Admitting: Women's Health

## 2019-09-29 DIAGNOSIS — Z1231 Encounter for screening mammogram for malignant neoplasm of breast: Secondary | ICD-10-CM

## 2019-12-19 ENCOUNTER — Ambulatory Visit (INDEPENDENT_AMBULATORY_CARE_PROVIDER_SITE_OTHER): Payer: BC Managed Care – PPO | Admitting: Nurse Practitioner

## 2019-12-19 ENCOUNTER — Encounter: Payer: Self-pay | Admitting: Nurse Practitioner

## 2019-12-19 ENCOUNTER — Other Ambulatory Visit: Payer: Self-pay

## 2019-12-19 VITALS — BP 118/78 | Ht 63.0 in | Wt 196.0 lb

## 2019-12-19 DIAGNOSIS — Z23 Encounter for immunization: Secondary | ICD-10-CM

## 2019-12-19 DIAGNOSIS — Z01419 Encounter for gynecological examination (general) (routine) without abnormal findings: Secondary | ICD-10-CM | POA: Diagnosis not present

## 2019-12-19 LAB — CBC WITH DIFFERENTIAL/PLATELET
Basophils Absolute: 29 cells/uL (ref 0–200)
Eosinophils Absolute: 238 cells/uL (ref 15–500)
RDW: 12 % (ref 11.0–15.0)
Total Lymphocyte: 32.2 %
WBC: 5.8 10*3/uL (ref 3.8–10.8)

## 2019-12-19 NOTE — Progress Notes (Signed)
   [place] [birth year] [id number]   History:  54 y.o. G2P2002 presents for annual exam without GYN complaints. 2016 endometrial ablation. Having irregular cycles with some menopausal symptoms. At one point she went 7 months without a cycle. Normal pap and mammogram history. HSV-1 with occasional outbreaks.  Gynecologic History Patient's last menstrual period was 12/08/2019. Period Pattern: (!) Irregular Menstrual Flow: Moderate Menstrual Control: Maxi pad Dysmenorrhea: (!) Mild Dysmenorrhea Symptoms: Cramping Contraception: none Last Pap: 09/16/2016. Results were: normal Last mammogram: 01/03/2018. Results were: normal Last colonoscopy: Never   Past medical history, past surgical history, family history and social history were all reviewed and documented in the EPIC chart.  ROS:  A ROS was performed and pertinent positives and negatives are included.  Exam:  Vitals:   12/19/19 0810  BP: 118/78  Weight: 196 lb (88.9 kg)  Height: 5\' 3"  (1.6 m)   Body mass index is 34.72 kg/m.  General appearance:  Normal Thyroid:  Symmetrical, normal in size, without palpable masses or nodularity. Respiratory  Auscultation:  Clear without wheezing or rhonchi Cardiovascular  Auscultation:  Regular rate, without rubs, murmurs or gallops  Edema/varicosities:  Not grossly evident Abdominal  Soft,nontender, without masses, guarding or rebound.  Liver/spleen:  No organomegaly noted  Hernia:  None appreciated  Skin  Inspection:  Grossly normal   Breasts: Examined lying and sitting.   Right: Without masses, retractions, discharge or axillary adenopathy.   Left: Without masses, retractions, discharge or axillary adenopathy. Gentitourinary   Inguinal/mons:  Normal without inguinal adenopathy  External genitalia:  Normal  BUS/Urethra/Skene's glands:  Normal  Vagina:  Normal  Cervix:  Normal  Uterus:  Normal in size, shape and contour.  Midline and mobile  Adnexa/parametria:      Rt: Without masses or tenderness.   Lt: Without masses or tenderness.  Anus and perineum: Normal  Digital rectal exam: Normal sphincter tone without palpated masses or tenderness  Assessment/Plan:  54 y.o. [id number] for annual exam.   Well female exam with routine gynecological exam - Plan: CBC with Differential/Platelet, Comprehensive metabolic panel, Lipid panel. Education provided on SBEs, importance of preventative screenings, current guidelines, high calcium diet, regular exercise, and multivitamin daily.   Need for immunization against influenza - Plan: Flu Vaccine QUAD 36+ mos IM  Perimenopause - Having irregular cycles and some menopausal symptoms.   Screening for cervical cancer - Normal pap history. Will repeat at 5-year interval per guidelines.   Screening for breast cancer - Normal mammogram history. Overdue for screening mammogram. Information provided on the breast center and she will schedule this soon.   Screening for colon cancer - Has not had screening colonoscopy. Discussed current guidelines and importance of preventative screenings. Information provided on [facility].  Follow up in a year for annual.       Tamela Gammon [facility], 8:20 AM 12/19/2019

## 2019-12-19 NOTE — Patient Instructions (Addendum)
Schedule Colonoscopy! [facility] [phone], [place] [address]  Schedule Mammogram! [facility] [phone]  [place], [place] [address]  Health Maintenance, Female Adopting a healthy lifestyle and getting preventive care are important in promoting health and wellness. Ask your health care provider about:  The right schedule for you to have regular tests and exams.  Things you can do on your own to prevent diseases and keep yourself healthy. What should I know about diet, weight, and exercise? Eat a healthy diet   Eat a diet that includes plenty of vegetables, fruits, low-fat dairy products, and lean protein.  Do not eat a lot of foods that are high in solid fats, added sugars, or sodium. Maintain a healthy weight Body mass index (BMI) is used to identify weight problems. It estimates body fat based on height and weight. Your health care provider can help determine your BMI and help you achieve or maintain a healthy weight. Get regular exercise Get regular exercise. This is one of the most important things you can do for your health. Most adults should:  Exercise for at least 150 minutes each week. The exercise should increase your heart rate and make you sweat (moderate-intensity exercise).  Do strengthening exercises at least twice a week. This is in addition to the moderate-intensity exercise.  Spend less time sitting. Even light physical activity can be beneficial. Watch cholesterol and blood lipids Have your blood tested for lipids and cholesterol at 53 years of age, then have this test every 5 years. Have your cholesterol levels checked more often if:  Your lipid or cholesterol levels are high.  You are older than 53 years of age.  You are at high risk for heart disease. What should I know about cancer screening? Depending on your health history and family history, you may need to have cancer screening  at various ages. This may include screening for:  Breast cancer.  Cervical cancer.  Colorectal cancer.  Skin cancer.  Lung cancer. What should I know about heart disease, diabetes, and high blood pressure? Blood pressure and heart disease  High blood pressure causes heart disease and increases the risk of stroke. This is more likely to develop in people who have high blood pressure readings, are of African descent, or are overweight.  Have your blood pressure checked: ? Every 3-5 years if you are 53-14 years of age. ? Every year if you are 79 years old or older. Diabetes Have regular diabetes screenings. This checks your fasting blood sugar level. Have the screening done:  Once every three years after age 53 if you are at a normal weight and have a low risk for diabetes.  More often and at a younger age if you are overweight or have a high risk for diabetes. What should I know about preventing infection? Hepatitis B If you have a higher risk for hepatitis B, you should be screened for this virus. Talk with your health care provider to find out if you are at risk for hepatitis B infection. Hepatitis C Testing is recommended for:  Everyone born from [birth year] through 1964.  Anyone with known risk factors for hepatitis C. Sexually transmitted infections (STIs)  Get screened for STIs, including gonorrhea and chlamydia, if: ? You are sexually active and are younger than 53 years of age. ? You are older than 53 years of age and your health care provider tells you that you are at  risk for this type of infection. ? Your sexual activity has changed since you were last screened, and you are at increased risk for chlamydia or gonorrhea. Ask your health care provider if you are at risk.  Ask your health care provider about whether you are at high risk for HIV. Your health care provider may recommend a prescription medicine to help prevent HIV infection. If you choose to take medicine to  prevent HIV, you should first get tested for HIV. You should then be tested every 3 months for as long as you are taking the medicine. Pregnancy  If you are about to stop having your period (premenopausal) and you may become pregnant, seek counseling before you get pregnant.  Take 400 to 800 micrograms (mcg) of folic acid every day if you become pregnant.  Ask for birth control (contraception) if you want to prevent pregnancy. Osteoporosis and menopause Osteoporosis is a disease in which the bones lose minerals and strength with aging. This can result in bone fractures. If you are 6 years old or older, or if you are at risk for osteoporosis and fractures, ask your health care provider if you should:  Be screened for bone loss.  Take a calcium or vitamin D supplement to lower your risk of fractures.  Be given hormone replacement therapy (HRT) to treat symptoms of menopause. Follow these instructions at home: Lifestyle  Do not use any products that contain nicotine or tobacco, such as cigarettes, e-cigarettes, and chewing tobacco. If you need help quitting, ask your health care provider.  Do not use street drugs.  Do not share needles.  Ask your health care provider for help if you need support or information about quitting drugs. Alcohol use  Do not drink alcohol if: ? Your health care provider tells you not to drink. ? You are pregnant, may be pregnant, or are planning to become pregnant.  If you drink alcohol: ? Limit how much you use to 0-1 drink a day. ? Limit intake if you are breastfeeding.  Be aware of how much alcohol is in your drink. In the [place] one drink equals one 12 oz bottle of beer (355 mL), one 5 oz glass of wine (148 mL), or one 1 oz glass of hard liquor (44 mL). General instructions  Schedule regular health, dental, and eye exams.  Stay current with your vaccines.  Tell your health care provider if: ? You often feel depressed. ? You have ever been  abused or do not feel safe at home. Summary  Adopting a healthy lifestyle and getting preventive care are important in promoting health and wellness.  Follow your health care provider's instructions about healthy diet, exercising, and getting tested or screened for diseases.  Follow your health care provider's instructions on monitoring your cholesterol and blood pressure. This information is not intended to replace advice given to you by your health care provider. Make sure you discuss any questions you have with your health care provider. Document Revised: 02/02/2018 Document Reviewed: 02/02/2018 Elsevier Patient Education  2020 [employer].  Menopause Menopause is the normal time of life when menstrual periods stop completely. It is usually confirmed by 12 months without a menstrual period. The transition to menopause (perimenopause) most often happens between the ages of 48 and 53. During perimenopause, hormone levels change in your body, which can cause symptoms and affect your health. Menopause may increase your risk for:  Loss of bone (osteoporosis), which causes bone breaks (fractures).  Depression.  Hardening  and narrowing of the arteries (atherosclerosis), which can cause heart attacks and strokes. What are the causes? This condition is usually caused by a natural change in hormone levels that happens as you get older. The condition may also be caused by surgery to remove both ovaries (bilateral oophorectomy). What increases the risk? This condition is more likely to start at an earlier age if you have certain medical conditions or treatments, including:  A tumor of the pituitary gland in the brain.  A disease that affects the ovaries and hormone production.  Radiation treatment for cancer.  Certain cancer treatments, such as chemotherapy or hormone (anti-estrogen) therapy.  Heavy smoking and excessive alcohol use.  Family history of early menopause. This condition is  also more likely to develop earlier in women who are very thin. What are the signs or symptoms? Symptoms of this condition include:  Hot flashes.  Irregular menstrual periods.  Night sweats.  Changes in feelings about sex. This could be a decrease in sex drive or an increased comfort around your sexuality.  Vaginal dryness and thinning of the vaginal walls. This may cause painful intercourse.  Dryness of the skin and development of wrinkles.  Headaches.  Problems sleeping (insomnia).  Mood swings or irritability.  Memory problems.  Weight gain.  Hair growth on the face and chest.  Bladder infections or problems with urinating. How is this diagnosed? This condition is diagnosed based on your medical history, a physical exam, your age, your menstrual history, and your symptoms. Hormone tests may also be done. How is this treated? In some cases, no treatment is needed. You and your health care provider should make a decision together about whether treatment is necessary. Treatment will be based on your individual condition and preferences. Treatment for this condition focuses on managing symptoms. Treatment may include:  Menopausal hormone therapy (MHT).  Medicines to treat specific symptoms or complications.  Acupuncture.  Vitamin or herbal supplements. Before starting treatment, make sure to let your health care provider know if you have a personal or family history of:  Heart disease.  Breast cancer.  Blood clots.  Diabetes.  Osteoporosis. Follow these instructions at home: Lifestyle  Do not use any products that contain nicotine or tobacco, such as cigarettes and e-cigarettes. If you need help quitting, ask your health care provider.  Get at least 30 minutes of physical activity on 5 or more days each week.  Avoid alcoholic and caffeinated beverages, as well as spicy foods. This may help prevent hot flashes.  Get 7-8 hours of sleep each night.  If you  have hot flashes, try: ? Dressing in layers. ? Avoiding things that may trigger hot flashes, such as spicy food, warm places, or stress. ? Taking slow, deep breaths when a hot flash starts. ? Keeping a fan in your home and office.  Find ways to manage stress, such as deep breathing, meditation, or journaling.  Consider going to group therapy with other women who are having menopause symptoms. Ask your health care provider about recommended group therapy meetings. Eating and drinking  Eat a healthy, balanced diet that contains whole grains, lean protein, low-fat dairy, and plenty of fruits and vegetables.  Your health care provider may recommend adding more soy to your diet. Foods that contain soy include tofu, tempeh, and soy milk.  Eat plenty of foods that contain calcium and vitamin D for bone health. Items that are rich in calcium include low-fat milk, yogurt, beans, almonds, sardines, broccoli, and kale.  Medicines  Take over-the-counter and prescription medicines only as told by your health care provider.  Talk with your health care provider before starting any herbal supplements. If prescribed, take vitamins and supplements as told by your health care provider. These may include: ? Calcium. Women age 81 and older should get 1,200 mg (milligrams) of calcium every day. ? Vitamin D. Women need 600-800 International Units of vitamin D each day. ? Vitamins B12 and B6. Aim for 50 micrograms of B12 and 1.5 mg of B6 each day. General instructions  Keep track of your menstrual periods, including: ? When they occur. ? How heavy they are and how long they last. ? How much time passes between periods.  Keep track of your symptoms, noting when they start, how often you have them, and how long they last.  Use vaginal lubricants or moisturizers to help with vaginal dryness and improve comfort during sex.  Keep all follow-up visits as told by your health care provider. This is important. This  includes any group therapy or counseling. Contact a health care provider if:  You are still having menstrual periods after age 78.  You have pain during sex.  You have not had a period for 12 months and you develop vaginal bleeding. Get help right away if:  You have: ? Severe depression. ? Excessive vaginal bleeding. ? Pain when you urinate. ? A fast or irregular heart beat (palpitations). ? Severe headaches. ? Abdomen (abdominal) pain or severe indigestion.  You fell and you think you have a broken bone.  You develop leg or chest pain.  You develop vision problems.  You feel a lump in your breast. Summary  Menopause is the normal time of life when menstrual periods stop completely. It is usually confirmed by 12 months without a menstrual period.  The transition to menopause (perimenopause) most often happens between the ages of 2 and 57.  Symptoms can be managed through medicines, lifestyle changes, and complementary therapies such as acupuncture.  Eat a balanced diet that is rich in nutrients to promote bone health and heart health and to manage symptoms during menopause. This information is not intended to replace advice given to you by your health care provider. Make sure you discuss any questions you have with your health care provider. Document Revised: 01/22/2017 Document Reviewed: 03/14/2016 Elsevier Patient Education  2020 [employer].

## 2019-12-20 LAB — CBC WITH DIFFERENTIAL/PLATELET
Absolute Monocytes: 476 cells/uL (ref 200–950)
Basophils Relative: 0.5 %
Eosinophils Relative: 4.1 %
HCT: 39.1 % (ref 35.0–45.0)
Hemoglobin: 13.8 g/dL (ref 11.7–15.5)
Lymphs Abs: 1868 cells/uL (ref 850–3900)
MCH: 30.8 pg (ref 27.0–33.0)
MCHC: 35.3 g/dL (ref 32.0–36.0)
MCV: 87.3 fL (ref 80.0–100.0)
MPV: 9.7 fL (ref 7.5–12.5)
Monocytes Relative: 8.2 %
Neutro Abs: 3190 cells/uL (ref 1500–7800)
Neutrophils Relative %: 55 %
Platelets: 293 10*3/uL (ref 140–400)
RBC: 4.48 10*6/uL (ref 3.80–5.10)

## 2019-12-20 LAB — COMPREHENSIVE METABOLIC PANEL
AG Ratio: 1.7 (calc) (ref 1.0–2.5)
ALT: 10 U/L (ref 6–29)
AST: 12 U/L (ref 10–35)
Albumin: 4.3 g/dL (ref 3.6–5.1)
Alkaline phosphatase (APISO): 60 U/L (ref 37–153)
BUN: 12 mg/dL (ref 7–25)
CO2: 26 mmol/L (ref 20–32)
Calcium: 9.5 mg/dL (ref 8.6–10.4)
Chloride: 105 mmol/L (ref 98–110)
Creat: 0.71 mg/dL (ref 0.50–1.05)
Globulin: 2.5 g/dL (calc) (ref 1.9–3.7)
Glucose, Bld: 81 mg/dL (ref 65–99)
Potassium: 4.1 mmol/L (ref 3.5–5.3)
Sodium: 139 mmol/L (ref 135–146)
Total Bilirubin: 0.5 mg/dL (ref 0.2–1.2)
Total Protein: 6.8 g/dL (ref 6.1–8.1)

## 2019-12-20 LAB — LIPID PANEL
Cholesterol: 193 mg/dL (ref <200)
HDL: 57 mg/dL (ref >=50)
LDL Cholesterol (Calc): 110 mg/dL (calc) — ABNORMAL HIGH
Non-HDL Cholesterol (Calc): 136 mg/dL (calc) — ABNORMAL HIGH (ref <130)
Total CHOL/HDL Ratio: 3.4 (calc) (ref <5.0)
Triglycerides: 143 mg/dL (ref <150)

## 2020-01-23 ENCOUNTER — Encounter: Payer: Self-pay | Admitting: Emergency Medicine

## 2020-01-23 ENCOUNTER — Ambulatory Visit
Admission: EM | Admit: 2020-01-23 | Discharge: 2020-01-23 | Disposition: A | Discharge status: Home / Self Care | Payer: BC Managed Care – PPO | Attending: Emergency Medicine | Admitting: Emergency Medicine

## 2020-01-23 DIAGNOSIS — J0101 Acute recurrent maxillary sinusitis: Secondary | ICD-10-CM

## 2020-01-23 MED ORDER — AMOXICILLIN-POT CLAVULANATE 875-125 MG PO TABS: 1.0000 | ORAL_TABLET | Freq: Two times a day (BID) | ORAL | 0 refills | Status: AC

## 2020-01-23 MED ORDER — BENZONATATE 100 MG PO CAPS: 100.0000 mg | ORAL_CAPSULE | Freq: Three times a day (TID) | ORAL | 0 refills | Status: DC

## 2020-01-23 MED ORDER — CETIRIZINE HCL 10 MG PO TABS: 10.0000 mg | ORAL_TABLET | Freq: Every day | ORAL | 0 refills | Status: DC

## 2020-01-23 MED ORDER — FLUTICASONE PROPIONATE 50 MCG/ACT NA SUSP: 1.0000 | Freq: Every day | NASAL | 0 refills | Status: DC

## 2020-01-23 NOTE — ED Triage Notes (Signed)
Pt c/o post nasal drip, sore throat, and sinus pressure since last Wednesday. States fully covid vaccinated and flu shot.

## 2020-01-23 NOTE — ED Provider Notes (Signed)
[facility]    CSN: [id number] Arrival date & time: 01/23/20  0801      History   Chief Complaint Chief Complaint  Patient presents with  . Nasal Congestion    HPI Jasmine Spencer is a 54 y.o. female  History was provided by the patient. Jasmine Spencer is a 54 y.o. female who presents for evaluation of symptoms of a URI, possible sinusitis. Symptoms include headache, nasal blockage, post nasal drip, sinus and nasal congestion and sore throat. Onset of symptoms was 6 days ago, gradually worsening since that time. Associated symptoms include facial pain.  She is drinking plenty of fluids. Evaluation to date: none. Treatment to date: decongestants The following portions of the patient's history were reviewed and updated as appropriate: allergies, current medications, past family history, past medical history, past social history, past surgical history and problem list.     Past Medical History:  Diagnosis Date  . Arthritis    R knee  . Complication of anesthesia    Pt. has panicky feeling  ( feels like she can't breathe)upon awaking fr. anesth. , NEEDS TO BE SITTING UPRIGHT  . Elevated cholesterol   . Family history of anesthesia complication    pt.'s father is very agitated /w MORPHINE, can tolerate DILAUDID  . H/O hiatal hernia    h/o - several yrs. ago, took Prilosec for 1 yr., no longer a problem   . Headache(784.0)    migraine- hormone related     Patient Active Problem List   Diagnosis Date Noted  . BMI 37.0-37.9, adult 03/24/2015  . Family history of coronary artery bypass surgery 03/30/2014  . SOB (shortness of breath) 03/30/2014  . Heart palpitations 03/30/2014  . Hypercholesteremia 03/09/2012  . Hyponatremia 10/03/2011  . Chronic knee pain 06/29/2011    Past Surgical History:  Procedure Laterality Date  . ADENOIDECTOMY     as a child  . CESAREAN SECTION  2002, 2007  . ENDOMETRIAL ABLATION  04/04/2014   [facility]  . KNEE SURGERY   [phone]  . SHOULDER SURGERY  [phone]   X 2  . TOTAL KNEE ARTHROPLASTY  09/30/2011   Procedure: TOTAL KNEE ARTHROPLASTY;  Surgeon: Ninetta Lights, MD;  Location: [facility];  Service: Orthopedics;  Laterality: Right;  . TOTAL KNEE ARTHROPLASTY  01/27/2018    OB History    Gravida  2   Para  2   Term  2   Preterm      AB      Living  2     SAB      TAB      Ectopic      Multiple      Live Births               Home Medications    Prior to Admission medications   Medication Sig Start Date End Date Taking? Authorizing Provider  amoxicillin-clavulanate (AUGMENTIN) 875-125 MG tablet Take 1 tablet by mouth every 12 (twelve) hours for 7 days. 01/23/20 01/30/20  Hall-Potvin, [place], PA-C  benzonatate (TESSALON) 100 MG capsule Take 1 capsule (100 mg total) by mouth every 8 (eight) hours. 01/23/20   Hall-Potvin, [place], PA-C  cetirizine (ZYRTEC ALLERGY) 10 MG tablet Take 1 tablet (10 mg total) by mouth daily. 01/23/20   Hall-Potvin, [place], PA-C  DUEXIS 800-26.6 MG TABS Take 1 tablet by mouth 3 (three) times daily as needed. 09/21/18   [provider]  fluticasone (FLONASE) 50 MCG/ACT nasal spray Place  1 spray into both nostrils daily. 01/23/20   Hall-Potvin, [place], PA-C  valACYclovir (VALTREX) 1000 MG tablet TAKE ONE TABLET BY MOUTH TWICE DAILY FOR 3 TO 5 DAYS FOR ONSET OUTBREAK. 07/13/19   Tamela Gammon, NP    Family History Family History  Problem Relation Age of Onset  . Diabetes Mother   . Hypertension Mother   . Cancer Mother        MELANOMA  . Heart disease Father   . Leukemia Father        CLL  . Heart attack Father   . Heart attack Sister   . Heart disease Sister   . Heart attack Brother   . Heart disease Brother   . Breast cancer Maternal Aunt     Social History Social History   Tobacco Use  . Smoking status: Never Smoker  . Smokeless tobacco: Never Used  Vaping Use  . Vaping Use: Never used  Substance Use Topics  .  Alcohol use: Yes    Comment: rare  . Drug use: No     Allergies   Milk-related compounds, Penicillins, and Latex   Review of Systems Review of Systems  Constitutional: Negative for fatigue and fever.  HENT: Positive for congestion, postnasal drip, rhinorrhea and sore throat. Negative for dental problem, ear pain, facial swelling, hearing loss, sinus pain, trouble swallowing and voice change.   Eyes: Negative for photophobia, pain and visual disturbance.  Respiratory: Positive for cough. Negative for shortness of breath.   Cardiovascular: Negative for chest pain and palpitations.  Gastrointestinal: Negative for diarrhea and vomiting.  Musculoskeletal: Negative for arthralgias and myalgias.  Neurological: Negative for dizziness and headaches.     Physical Exam Triage Vital Signs ED Triage Vitals  Enc Vitals Group     BP 01/23/20 0813 119/78     Pulse Rate 01/23/20 0813 74     Resp 01/23/20 0813 20     Temp 01/23/20 0813 97.8 F (36.6 C)     Temp Source 01/23/20 0813 Oral     SpO2 01/23/20 0813 97 %     Weight --      Height --      Head Circumference --      Peak Flow --      Pain Score 01/23/20 0818 0     Pain Loc --      Pain Edu? --      Excl. in [place]? --    No data found.  Updated Vital Signs BP 119/78 (BP Location: Left Arm)   Pulse 74   Temp 97.8 F (36.6 C) (Oral)   Resp 20   SpO2 97%   Visual Acuity Right Eye Distance:   Left Eye Distance:   Bilateral Distance:    Right Eye Near:   Left Eye Near:    Bilateral Near:     Physical Exam Constitutional:      General: She is not in acute distress.    Appearance: She is not ill-appearing or diaphoretic.  HENT:     Head: Normocephalic and atraumatic.     Right Ear: Tympanic membrane and ear canal normal.     Left Ear: Tympanic membrane and ear canal normal.     Nose:     Comments: Bilateral turbinate edema with erythematous mucosal lining. Scant congestion in left nare. Positive maxillary sinus  tenderness bilaterally.    Mouth/Throat:     Mouth: Mucous membranes are moist.     Pharynx: Oropharynx is clear.  No oropharyngeal exudate or posterior oropharyngeal erythema.     Comments: Cobblestoning present Eyes:     General: No scleral icterus.    Conjunctiva/sclera: Conjunctivae normal.     Pupils: Pupils are equal, round, and reactive to light.  Neck:     Comments: Trachea midline, negative JVD Cardiovascular:     Rate and Rhythm: Normal rate and regular rhythm.     Heart sounds: No murmur heard.  No gallop.   Pulmonary:     Effort: Pulmonary effort is normal. No respiratory distress.     Breath sounds: No wheezing, rhonchi or rales.  Musculoskeletal:     Cervical back: Neck supple. No tenderness.  Lymphadenopathy:     Cervical: No cervical adenopathy.  Skin:    Capillary Refill: Capillary refill takes less than 2 seconds.     Coloration: Skin is not jaundiced or pale.     Findings: No rash.  Neurological:     General: No focal deficit present.     Mental Status: She is alert and oriented to person, place, and time.      UC Treatments / Results  Labs (all labs ordered are listed, but only abnormal results are displayed) Labs Reviewed - No data to display  EKG   Radiology No results found.  Procedures Procedures (including critical care time)  Medications Ordered in UC Medications - No data to display  Initial Impression / Assessment and Plan / UC Course  I have reviewed the triage vital signs and the nursing notes.  Pertinent labs & imaging results that were available during my care of the patient were reviewed by me and considered in my medical decision making (see chart for details).     Afebrile, nontoxic in office today. Fully Covid vaccinated: Declined Covid testing. H&P consistent with acute sinusitis. Reviewed clinical guidelines with patient: Should try supportive care until at least 7 days. We'll add antihistamine, intranasal steroid spray as  adjuvant conservative therapy. Patient with Augmentin Thursday if no improvement or worsening of condition.  Return precautions discussed, pt verbalized understanding and is agreeable to plan. Final Clinical Impressions(s) / UC Diagnoses   Final diagnoses:  Acute recurrent maxillary sinusitis   Discharge Instructions   None    ED Prescriptions    Medication Sig Dispense Auth. Provider   cetirizine (ZYRTEC ALLERGY) 10 MG tablet Take 1 tablet (10 mg total) by mouth daily. 30 tablet Hall-Potvin, [place], PA-C   fluticasone (FLONASE) 50 MCG/ACT nasal spray Place 1 spray into both nostrils daily. 16 g Hall-Potvin, [place], PA-C   amoxicillin-clavulanate (AUGMENTIN) 875-125 MG tablet Take 1 tablet by mouth every 12 (twelve) hours for 7 days. 14 tablet Hall-Potvin, [place], PA-C   benzonatate (TESSALON) 100 MG capsule Take 1 capsule (100 mg total) by mouth every 8 (eight) hours. 21 capsule Hall-Potvin, [place], PA-C     PDMP not reviewed this encounter.   Hall-Potvin, [place], PA-C 01/23/20 0900

## 2020-02-12 ENCOUNTER — Other Ambulatory Visit: Payer: Self-pay | Admitting: Nurse Practitioner

## 2020-02-12 DIAGNOSIS — Z0000 Encounter for general adult medical examination without abnormal findings: Secondary | ICD-10-CM

## 2020-04-05 ENCOUNTER — Ambulatory Visit
Admission: RE | Admit: 2020-04-05 | Discharge: 2020-04-05 | Disposition: A | Discharge status: Home / Self Care | Payer: BC Managed Care – PPO | Source: Ambulatory Visit | Attending: Nurse Practitioner | Admitting: Nurse Practitioner

## 2020-04-05 ENCOUNTER — Other Ambulatory Visit: Payer: Self-pay

## 2020-04-05 ENCOUNTER — Ambulatory Visit: Payer: BC Managed Care – PPO

## 2020-04-05 DIAGNOSIS — Z0000 Encounter for general adult medical examination without abnormal findings: Secondary | ICD-10-CM

## 2020-04-10 ENCOUNTER — Other Ambulatory Visit: Payer: Self-pay | Admitting: Nurse Practitioner

## 2020-04-10 DIAGNOSIS — R928 Other abnormal and inconclusive findings on diagnostic imaging of breast: Secondary | ICD-10-CM

## 2020-04-25 ENCOUNTER — Other Ambulatory Visit: Payer: BC Managed Care – PPO

## 2020-05-02 ENCOUNTER — Ambulatory Visit
Admission: RE | Admit: 2020-05-02 | Discharge: 2020-05-02 | Disposition: A | Discharge status: Home / Self Care | Payer: BC Managed Care – PPO | Source: Ambulatory Visit | Attending: Nurse Practitioner | Admitting: Nurse Practitioner

## 2020-05-02 ENCOUNTER — Ambulatory Visit: Payer: BC Managed Care – PPO

## 2020-05-02 ENCOUNTER — Other Ambulatory Visit: Payer: Self-pay

## 2020-05-02 DIAGNOSIS — R928 Other abnormal and inconclusive findings on diagnostic imaging of breast: Secondary | ICD-10-CM

## 2020-08-12 ENCOUNTER — Other Ambulatory Visit: Payer: Self-pay | Admitting: Nurse Practitioner

## 2020-08-12 DIAGNOSIS — B009 Herpesviral infection, unspecified: Secondary | ICD-10-CM

## 2020-08-12 NOTE — Telephone Encounter (Signed)
Annual exam due in Oct. 2022

## 2020-08-14 ENCOUNTER — Encounter (HOSPITAL_COMMUNITY): Payer: Self-pay

## 2020-08-14 ENCOUNTER — Other Ambulatory Visit: Payer: Self-pay

## 2020-08-14 ENCOUNTER — Emergency Department (HOSPITAL_COMMUNITY)
Admission: EM | Admit: 2020-08-14 | Discharge: 2020-08-14 | Disposition: A | Discharge status: Home / Self Care | Payer: BC Managed Care – PPO | Attending: Emergency Medicine | Admitting: Emergency Medicine

## 2020-08-14 ENCOUNTER — Emergency Department (HOSPITAL_COMMUNITY): Payer: BC Managed Care – PPO

## 2020-08-14 DIAGNOSIS — Z91040 Latex allergy status: Secondary | ICD-10-CM | POA: Insufficient documentation

## 2020-08-14 DIAGNOSIS — R109 Unspecified abdominal pain: Secondary | ICD-10-CM | POA: Diagnosis present

## 2020-08-14 DIAGNOSIS — Z96651 Presence of right artificial knee joint: Secondary | ICD-10-CM | POA: Insufficient documentation

## 2020-08-14 DIAGNOSIS — K8020 Calculus of gallbladder without cholecystitis without obstruction: Secondary | ICD-10-CM | POA: Insufficient documentation

## 2020-08-14 LAB — CBC WITH DIFFERENTIAL/PLATELET
Abs Immature Granulocytes: 0.03 10*3/uL (ref 0.00–0.07)
Basophils Absolute: 0.1 10*3/uL (ref 0.0–0.1)
Basophils Relative: 1 %
Eosinophils Absolute: 0.2 10*3/uL (ref 0.0–0.5)
Eosinophils Relative: 2 %
HCT: 40 % (ref 36.0–46.0)
Hemoglobin: 13.8 g/dL (ref 12.0–15.0)
Immature Granulocytes: 0 %
Lymphocytes Relative: 15 %
Lymphs Abs: 1.5 10*3/uL (ref 0.7–4.0)
MCH: 29.6 pg (ref 26.0–34.0)
MCHC: 34.5 g/dL (ref 30.0–36.0)
MCV: 85.8 fL (ref 80.0–100.0)
Monocytes Absolute: 0.6 10*3/uL (ref 0.1–1.0)
Monocytes Relative: 6 %
Neutro Abs: 7.7 10*3/uL (ref 1.7–7.7)
Neutrophils Relative %: 76 %
Platelets: 276 10*3/uL (ref 150–400)
RBC: 4.66 MIL/uL (ref 3.87–5.11)
RDW: 11.9 % (ref 11.5–15.5)
WBC: 10.1 10*3/uL (ref 4.0–10.5)
nRBC: 0 % (ref 0.0–0.2)

## 2020-08-14 LAB — COMPREHENSIVE METABOLIC PANEL
ALT: 16 U/L (ref 0–44)
AST: 16 U/L (ref 15–41)
Albumin: 4.4 g/dL (ref 3.5–5.0)
Alkaline Phosphatase: 55 U/L (ref 38–126)
Anion gap: 12 (ref 5–15)
BUN: 12 mg/dL (ref 6–20)
CO2: 25 mmol/L (ref 22–32)
Calcium: 9.7 mg/dL (ref 8.9–10.3)
Chloride: 100 mmol/L (ref 98–111)
Creatinine, Ser: 0.8 mg/dL (ref 0.44–1.00)
GFR, Estimated: >60 mL/min (ref >60)
Glucose, Bld: 139 mg/dL — ABNORMAL HIGH (ref 70–99)
Potassium: 3.8 mmol/L (ref 3.5–5.1)
Sodium: 137 mmol/L (ref 135–145)
Total Bilirubin: 0.6 mg/dL (ref 0.3–1.2)
Total Protein: 7.3 g/dL (ref 6.5–8.1)

## 2020-08-14 LAB — LIPASE, BLOOD: Lipase: 32 U/L (ref 11–51)

## 2020-08-14 MED ORDER — ONDANSETRON 4 MG PO TBDP
4.0000 mg | ORAL_TABLET | Freq: Once | ORAL | Status: AC
  Administered 2020-08-14: 04:00:00 4 mg via ORAL
  Filled 2020-08-14: qty 1

## 2020-08-14 MED ORDER — OXYCODONE-ACETAMINOPHEN 5-325 MG PO TABS
1.0000 | ORAL_TABLET | Freq: Once | ORAL | Status: AC
  Administered 2020-08-14: 04:00:00 1 via ORAL
  Filled 2020-08-14: qty 1

## 2020-08-14 NOTE — ED Notes (Signed)
Urine sent to lab 

## 2020-08-14 NOTE — Discharge Instructions (Addendum)
Take the pain medicine you have at home as needed.  Would recommend clear liquid diet today and then bland diet.  Call [facility] surgery for follow-up for consideration for gallbladder removal.  If pain reoccurs and last 3 to 4 hours nonstop may need to return.

## 2020-08-14 NOTE — ED Triage Notes (Signed)
Pt reports abd pain that began around 10pm last night. Dx with gallstones in 2018/2019 and believes this is what it is. Pt actively throwing up and unable to sit still due to pain.

## 2020-08-14 NOTE — ED Provider Notes (Signed)
MSE was initiated and I personally evaluated the patient and placed orders (if any) at  3:53 AM on August 14, 2020.  Patient with known gall stones, periodic pain episodes but nothing that has lasted as long as tonight. Associated with vomiting. No fever. Pain started around 10:00 pm last night (6/21).   Today's Vitals   08/14/20 0316 08/14/20 0327  BP: (!) 142/87   Pulse: 61   Resp: 15   Temp: 97.8 F (36.6 C)   TempSrc: Oral   SpO2: 96%   Weight:  95.3 kg  Height:  5\' 3"  (1.6 m)  PainSc:  9    Body mass index is 37.2 kg/m.  Very uncomfortable appearing Afebrile Upper abdominal tenderness with guarding RRR Lungs clear  The patient appears stable so that the remainder of the MSE may be completed by another provider.   Charlann Lange, PA-C 08/14/20 0354    Orpah Greek, MD 08/14/20 [phone]

## 2020-08-14 NOTE — ED Provider Notes (Signed)
[facility] EMERGENCY DEPARTMENT Provider Note   CSN: [id number] Arrival date & time: 08/14/20  0310     History Chief Complaint  Patient presents with   Abdominal Pain    Jasmine Spencer is a 55 y.o. female.  Patient diagnosed with symptomatic gallstones in 2019.  Seen by [facility] surgery but opted not to go forward with the removal of the gallbladder at that time.  When the pandemic occurred.  Patient has had several small bouts of right upper quadrant pain sometimes with one episode of vomiting.  Last night around 10 PM had pretty significant pain lasted till about 6 this morning.  Multiple episodes of vomiting.  Patient now feels much better.  Minimal discomfort.      Past Medical History:  Diagnosis Date   Arthritis    R knee   Complication of anesthesia    Pt. has panicky feeling  ( feels like she can't breathe)upon awaking fr. anesth. , NEEDS TO BE SITTING UPRIGHT   Elevated cholesterol    Family history of anesthesia complication    pt.'s father is very agitated /w MORPHINE, can tolerate DILAUDID   H/O hiatal hernia    h/o - several yrs. ago, took Prilosec for 1 yr., no longer a problem    Headache(784.0)    migraine- hormone related     Patient Active Problem List   Diagnosis Date Noted   BMI 37.0-37.9, adult 03/24/2015   Family history of coronary artery bypass surgery 03/30/2014   SOB (shortness of breath) 03/30/2014   Heart palpitations 03/30/2014   Hypercholesteremia 03/09/2012   Hyponatremia 10/03/2011   Chronic knee pain 06/29/2011    Past Surgical History:  Procedure Laterality Date   ADENOIDECTOMY     as a child   CESAREAN SECTION  2002, 2007   ENDOMETRIAL ABLATION  04/04/2014   [facility]   KNEE SURGERY  [phone]   SHOULDER SURGERY  [phone]   X 2   TOTAL KNEE ARTHROPLASTY  09/30/2011   Procedure: TOTAL KNEE ARTHROPLASTY;  Surgeon: Ninetta Lights, MD;  Location: [place];  Service: Orthopedics;  Laterality:  Right;   TOTAL KNEE ARTHROPLASTY  01/27/2018     OB History     Gravida  2   Para  2   Term  2   Preterm      AB      Living  2      SAB      IAB      Ectopic      Multiple      Live Births              Family History  Problem Relation Age of Onset   Diabetes Mother    Hypertension Mother    Cancer Mother        MELANOMA   Heart disease Father    Leukemia Father        CLL   Heart attack Father    Heart attack Sister    Heart disease Sister    Heart attack Brother    Heart disease Brother    Breast cancer Maternal Aunt     Social History   Tobacco Use   Smoking status: Never   Smokeless tobacco: Never  Vaping Use   Vaping Use: Never used  Substance Use Topics   Alcohol use: Yes    Comment: rare   Drug use: No    Home Medications Prior to Admission  medications   Medication Sig Start Date End Date Taking? Authorizing Provider  benzonatate (TESSALON) 100 MG capsule Take 1 capsule (100 mg total) by mouth every 8 (eight) hours. 01/23/20   Hall-Potvin, [place], PA-C  cetirizine (ZYRTEC ALLERGY) 10 MG tablet Take 1 tablet (10 mg total) by mouth daily. 01/23/20   Hall-Potvin, [place], PA-C  DUEXIS 800-26.6 MG TABS Take 1 tablet by mouth 3 (three) times daily as needed. 09/21/18   [provider]  fluticasone (FLONASE) 50 MCG/ACT nasal spray Place 1 spray into both nostrils daily. 01/23/20   Hall-Potvin, [place], PA-C  valACYclovir (VALTREX) 1000 MG tablet TAKE ONE TABLET BY MOUTH TWICE DAILY FOR 3 TO 5 DAYS FOR ONSET OUTBREAK. 08/12/20   Marny Lowenstein A, NP    Allergies    Milk-related compounds, Penicillins, and Latex  Review of Systems   Review of Systems  Constitutional:  Negative for chills and fever.  HENT:  Negative for ear pain and sore throat.   Eyes:  Negative for pain and visual disturbance.  Respiratory:  Negative for cough and shortness of breath.   Cardiovascular:  Negative for chest pain and palpitations.   Gastrointestinal:  Positive for abdominal pain, nausea and vomiting.  Genitourinary:  Negative for dysuria and hematuria.  Musculoskeletal:  Negative for arthralgias and back pain.  Skin:  Negative for color change and rash.  Neurological:  Negative for seizures and syncope.  All other systems reviewed and are negative.  Physical Exam Updated Vital Signs BP 127/77 (BP Location: Right Arm)   Pulse 64   Temp 97.8 F (36.6 C) (Oral)   Resp (!) 25   Ht 1.6 m (5\' 3" )   Wt 95.3 kg   SpO2 98%   BMI 37.20 kg/m   Physical Exam Vitals and nursing note reviewed.  Constitutional:      General: She is not in acute distress.    Appearance: Normal appearance. She is well-developed.  HENT:     Head: Normocephalic and atraumatic.  Eyes:     Extraocular Movements: Extraocular movements intact.     Conjunctiva/sclera: Conjunctivae normal.     Pupils: Pupils are equal, round, and reactive to light.  Cardiovascular:     Rate and Rhythm: Normal rate and regular rhythm.     Heart sounds: No murmur heard. Pulmonary:     Effort: Pulmonary effort is normal. No respiratory distress.     Breath sounds: Normal breath sounds.  Abdominal:     Palpations: Abdomen is soft.     Tenderness: There is abdominal tenderness. There is no guarding.     Comments: Very slight tenderness right upper quadrant at the margin of the ribs.  No guarding.  Musculoskeletal:        General: Normal range of motion.     Cervical back: Normal range of motion and neck supple.  Skin:    General: Skin is warm and dry.  Neurological:     General: No focal deficit present.     Mental Status: She is alert and oriented to person, place, and time.    ED Results / Procedures / Treatments   Labs (all labs ordered are listed, but only abnormal results are displayed) Labs Reviewed  COMPREHENSIVE METABOLIC PANEL - Abnormal; Notable for the following components:      Result Value   Glucose, Bld 139 (*)    All other components  within normal limits  CBC WITH DIFFERENTIAL/PLATELET  LIPASE, BLOOD    EKG None  Radiology US  Abdomen Limited  Result Date: 08/14/2020 CLINICAL DATA:  Gallstones EXAM: ULTRASOUND ABDOMEN LIMITED RIGHT UPPER QUADRANT COMPARISON:  05/10/2017 FINDINGS: Gallbladder: Shadowing gallstones measuring up to 2.2 cm in diameter. No wall thickening or focal tenderness. Common bile duct: Diameter: 4 mm.  Where visualized, no filling defect. Liver: No focal lesion identified. Within normal limits in parenchymal echogenicity. Portal vein is patent on color Doppler imaging with normal direction of blood flow towards the liver. IMPRESSION: 1. Cholelithiasis. 2. No acute finding. Electronically Signed   By: Monte Fantasia M.D.   On: 08/14/2020 04:48    Procedures Procedures   Medications Ordered in ED Medications  ondansetron (ZOFRAN-ODT) disintegrating tablet 4 mg (4 mg Oral Given 08/14/20 0417)  oxyCODONE-acetaminophen (PERCOCET/ROXICET) 5-325 MG per tablet 1 tablet (1 tablet Oral Given 08/14/20 0417)    ED Course  I have reviewed the triage vital signs and the nursing notes.  Pertinent labs & imaging results that were available during my care of the patient were reviewed by me and considered in my medical decision making (see chart for details).    MDM Rules/Calculators/A&P                          Patient first diagnosed with cholelithiasis symptomatic back in 2019.  Was seen by [facility] surgery.  Forward with gallbladder removal and then the pandemic occurred.  Patient with a significant attack last night that started around 10 in the evening.  And went till about 6 this morning.  Patient had multiple episodes of vomiting.  But overall now just has minimal discomfort feels tremendously better.  Patient has had small gallbladder attacks since 2019 but this was one of the worst lasting the longest.  Patient labs without significant abnormality.  Ultrasound shows no evidence of acute  cholecystitis.  Common bile duct is normal.  There are gallstones.  Again no leukocytosis, liver function test are normal.  And lipase is normal  Patient given precautions we will have her follow-up with [facility] surgery for elective cholecystectomy. Final Clinical Impression(s) / ED Diagnoses Final diagnoses:  Calculus of gallbladder without cholecystitis without obstruction    Rx / DC Orders ED Discharge Orders     None        Fredia Sorrow, MD 08/14/20 1240

## 2020-09-20 HISTORY — PX: CHOLECYSTECTOMY: SHX55

## 2021-03-10 ENCOUNTER — Encounter: Payer: Self-pay | Admitting: Gastroenterology

## 2021-03-17 ENCOUNTER — Other Ambulatory Visit: Payer: Self-pay

## 2021-03-17 ENCOUNTER — Encounter: Payer: Self-pay | Admitting: Physician Assistant

## 2021-03-17 ENCOUNTER — Ambulatory Visit: Payer: BC Managed Care – PPO | Admitting: Physician Assistant

## 2021-03-17 VITALS — BP 124/68 | HR 61 | Temp 98.8°F | Ht 63.0 in | Wt 211.8 lb

## 2021-03-17 DIAGNOSIS — Z23 Encounter for immunization: Secondary | ICD-10-CM

## 2021-03-17 DIAGNOSIS — R1013 Epigastric pain: Secondary | ICD-10-CM | POA: Diagnosis not present

## 2021-03-17 MED ORDER — PANTOPRAZOLE SODIUM 40 MG PO TBEC: 40.0000 mg | DELAYED_RELEASE_TABLET | Freq: Every day | ORAL | 3 refills | Status: DC

## 2021-03-17 NOTE — Progress Notes (Signed)
Acute Office Visit  Subjective:    Patient ID: Jasmine Spencer, female    DOB: 1966-01-07, 56 y.o.   MRN: [id number]  Chief Complaint  Patient presents with   Gastroesophageal Reflux    HPI: Patient is in today for complaints of reflux symptoms - pt states she has had issues for years with reflux - describes as pain in middle of chest that radiates up to her throat - is having lots of belching as well States she does not normally take medication for this but drinks cold water and symptoms resolve - however in the past month symptoms have worsened Last bad episode was over a week ago and lasted for over 12 hours - denies dyspnea, sweats - states does not occur with activity - eating does not worsen symptoms Pt does have an appt with [facility] in the next few weeks  Pt would like flu shot today  Past Medical History:  Diagnosis Date   Arthritis    R knee   Complication of anesthesia    Pt. has panicky feeling  ( feels like she can't breathe)upon awaking fr. anesth. , NEEDS TO BE SITTING UPRIGHT   Elevated cholesterol    Family history of anesthesia complication    pt.'s father is very agitated /w MORPHINE, can tolerate DILAUDID   H/O hiatal hernia    h/o - several yrs. ago, took Prilosec for 1 yr., no longer a problem    Headache(784.0)    migraine- hormone related     Past Surgical History:  Procedure Laterality Date   ADENOIDECTOMY     as a child   CESAREAN SECTION  2002, 2007   CHOLECYSTECTOMY  09/20/2020   ENDOMETRIAL ABLATION  04/04/2014   [facility]   KNEE SURGERY  [phone]   SHOULDER SURGERY  [phone]   X 2   TOTAL KNEE ARTHROPLASTY  09/30/2011   Procedure: TOTAL KNEE ARTHROPLASTY;  Surgeon: Ninetta Lights, MD;  Location: [facility];  Service: Orthopedics;  Laterality: Right;   TOTAL KNEE ARTHROPLASTY  01/27/2018    Family History  Problem Relation Age of Onset   Diabetes Mother    Hypertension Mother    Cancer Mother        MELANOMA   Heart disease  Father    Leukemia Father        CLL   Heart attack Father    Heart attack Sister    Heart disease Sister    Heart attack Brother    Heart disease Brother    Breast cancer Maternal Aunt     Social History   Socioeconomic History   Marital status: Significant Other    Spouse name: Not on file   Number of children: 2   Years of education: Not on file   Highest education level: Not on file  Occupational History   Not on file  Tobacco Use   Smoking status: Never   Smokeless tobacco: Never  Vaping Use   Vaping Use: Never used  Substance and Sexual Activity   Alcohol use: Not Currently    Comment: rare   Drug use: No   Sexual activity: Not Currently    Comment: declined insurance questions  Other Topics Concern   Not on file  Social History Narrative   Not on file   Social Determinants of Health   Financial Resource Strain: Not on file  Food Insecurity: Not on file  Transportation Needs: Not on file  Physical Activity: Not on  file  Stress: Not on file  Social Connections: Not on file  Intimate Partner Violence: Not on file    Outpatient Medications Prior to Visit  Medication Sig Dispense Refill   valACYclovir (VALTREX) 1000 MG tablet TAKE ONE TABLET BY MOUTH TWICE DAILY FOR 3 TO 5 DAYS FOR ONSET OUTBREAK. 10 tablet 3   benzonatate (TESSALON) 100 MG capsule Take 1 capsule (100 mg total) by mouth every 8 (eight) hours. 21 capsule 0   cetirizine (ZYRTEC ALLERGY) 10 MG tablet Take 1 tablet (10 mg total) by mouth daily. 30 tablet 0   DUEXIS 800-26.6 MG TABS Take 1 tablet by mouth 3 (three) times daily as needed.     fluticasone (FLONASE) 50 MCG/ACT nasal spray Place 1 spray into both nostrils daily. 16 g 0   No facility-administered medications prior to visit.    Allergies  Allergen Reactions   Milk-Related Compounds Other (See Comments)    Lactose intolerance.   Penicillins Other (See Comments)    Childhood allergy.   Latex Rash    Review of  Systems CONSTITUTIONAL: Negative for chills, fatigue, fever, unintentional weight gain and unintentional weight loss.  E/N/T: Negative for ear pain, nasal congestion and sore throat.  CARDIOVASCULAR: Negative for chest pain, dizziness, palpitations and pedal edema.  RESPIRATORY: Negative for recent cough and dyspnea.  GASTROINTESTINAL: see HPI         Objective:    Physical Exam PHYSICAL EXAM:   VS: BP 124/68 (BP Location: Right Arm, Patient Position: Sitting)    Pulse 61    Temp 98.8 F (37.1 C) (Temporal)    Ht 5\' 3"  (1.6 m)    Wt 211 lb 12.8 oz (96.1 kg)    SpO2 98%    BMI 37.52 kg/m   GEN: Well nourished, well developed, in no acute distress  Cardiac: RRR; no murmurs, rubs, or gallops,no edema - Respiratory:  normal respiratory rate and pattern with no distress - normal breath sounds with no rales, rhonchi, wheezes or rubs GI: normal bowel sounds, no masses or tenderness Psych: euthymic mood, appropriate affect and demeanor  EKG - normal - BP 124/68 (BP Location: Right Arm, Patient Position: Sitting)    Pulse 61    Temp 98.8 F (37.1 C) (Temporal)    Ht 5\' 3"  (1.6 m)    Wt 211 lb 12.8 oz (96.1 kg)    SpO2 98%    BMI 37.52 kg/m  Wt Readings from Last 3 Encounters:  03/17/21 211 lb 12.8 oz (96.1 kg)  08/14/20 210 lb (95.3 kg)  12/19/19 196 lb (88.9 kg)    Health Maintenance Due  Topic Date Due   COVID-19 Vaccine (1) Never done   HIV Screening  Never done   Hepatitis C Screening  Never done   TETANUS/TDAP  Never done   COLONOSCOPY (Pts 45-23yrs Insurance coverage will need to be confirmed)  Never done   Zoster Vaccines- Shingrix (1 of 2) Never done   PAP SMEAR-Modifier  09/17/2019    There are no preventive care reminders to display for this patient.   Lab Results  Component Value Date   TSH 0.741 03/13/2015   Lab Results  Component Value Date   WBC 10.1 08/14/2020   HGB 13.8 08/14/2020   HCT 40.0 08/14/2020   MCV 85.8 08/14/2020   PLT 276 08/14/2020    Lab Results  Component Value Date   NA 137 08/14/2020   K 3.8 08/14/2020   CO2 25 08/14/2020   GLUCOSE  139 (H) 08/14/2020   BUN 12 08/14/2020   CREATININE 0.80 08/14/2020   BILITOT 0.6 08/14/2020   ALKPHOS 55 08/14/2020   AST 16 08/14/2020   ALT 16 08/14/2020   PROT 7.3 08/14/2020   ALBUMIN 4.4 08/14/2020   CALCIUM 9.7 08/14/2020   ANIONGAP 12 08/14/2020   Lab Results  Component Value Date   CHOL 193 12/19/2019   Lab Results  Component Value Date   HDL 57 12/19/2019   Lab Results  Component Value Date   LDLCALC 110 (H) 12/19/2019   Lab Results  Component Value Date   TRIG 143 12/19/2019   Lab Results  Component Value Date   CHOLHDL 3.4 12/19/2019   No results found for: HGBA1C     Assessment & Plan:   Problem List Items Addressed This Visit   None Visit Diagnoses     Midepigastric pain    -  Primary   Relevant Medications   pantoprazole (PROTONIX) 40 MG tablet   Other Relevant Orders   EKG 12-Lead   CBC with Differential/Platelet   Comprehensive metabolic panel   H Pylori, IGM, IGG, IGA AB Follow up with GI as scheduled   Needs flu shot       Relevant Orders   Flu Vaccine MDCK QUAD PF      Meds ordered this encounter  Medications   pantoprazole (PROTONIX) 40 MG tablet    Sig: Take 1 tablet (40 mg total) by mouth daily.    Dispense:  30 tablet    Refill:  3    Order Specific Question:   Supervising Provider    Answer:   Marge Duncans [id number]    Orders Placed This Encounter  Procedures   Flu Vaccine MDCK QUAD PF   CBC with Differential/Platelet   Comprehensive metabolic panel   H Pylori, IGM, IGG, IGA AB   EKG 12-Lead     Follow-up: Return if symptoms worsen or fail to improve.  An After Visit Summary was printed and given to the patient.  Yetta Flock [facility] [phone]

## 2021-03-24 LAB — COMPREHENSIVE METABOLIC PANEL
ALT: 14 IU/L (ref 0–32)
AST: 11 IU/L (ref 0–40)
Albumin/Globulin Ratio: 1.8 (ref 1.2–2.2)
Albumin: 4.6 g/dL (ref 3.8–4.9)
Alkaline Phosphatase: 77 IU/L (ref 44–121)
BUN/Creatinine Ratio: 14 (ref 9–23)
BUN: 10 mg/dL (ref 6–24)
Bilirubin Total: 0.3 mg/dL (ref 0.0–1.2)
CO2: 22 mmol/L (ref 20–29)
Calcium: 9.7 mg/dL (ref 8.7–10.2)
Chloride: 101 mmol/L (ref 96–106)
Creatinine, Ser: 0.74 mg/dL (ref 0.57–1.00)
Globulin, Total: 2.5 g/dL (ref 1.5–4.5)
Glucose: 82 mg/dL (ref 70–99)
Potassium: 4.4 mmol/L (ref 3.5–5.2)
Sodium: 137 mmol/L (ref 134–144)
Total Protein: 7.1 g/dL (ref 6.0–8.5)
eGFR: 96 mL/min/{1.73_m2} (ref >59)

## 2021-03-24 LAB — CBC WITH DIFFERENTIAL/PLATELET
Basophils Absolute: 0 10*3/uL (ref 0.0–0.2)
Basos: 0 %
EOS (ABSOLUTE): 0.3 10*3/uL (ref 0.0–0.4)
Eos: 4 %
Hematocrit: 37.7 % (ref 34.0–46.6)
Hemoglobin: 13.2 g/dL (ref 11.1–15.9)
Immature Grans (Abs): 0 10*3/uL (ref 0.0–0.1)
Immature Granulocytes: 0 %
Lymphocytes Absolute: 2.5 10*3/uL (ref 0.7–3.1)
Lymphs: 32 %
MCH: 30.2 pg (ref 26.6–33.0)
MCHC: 35 g/dL (ref 31.5–35.7)
MCV: 86 fL (ref 79–97)
Monocytes Absolute: 0.7 10*3/uL (ref 0.1–0.9)
Monocytes: 9 %
Neutrophils Absolute: 4.2 10*3/uL (ref 1.4–7.0)
Neutrophils: 55 %
Platelets: 250 10*3/uL (ref 150–450)
RBC: 4.37 x10E6/uL (ref 3.77–5.28)
RDW: 12.5 % (ref 11.7–15.4)
WBC: 7.8 10*3/uL (ref 3.4–10.8)

## 2021-03-24 LAB — H PYLORI, IGM, IGG, IGA AB
H pylori, IgM Abs: <9 units (ref 0.0–8.9)
H. pylori, IgA Abs: <9 units (ref 0.0–8.9)
H. pylori, IgG AbS: 0.26 Index Value (ref 0.00–0.79)

## 2021-03-28 ENCOUNTER — Ambulatory Visit (INDEPENDENT_AMBULATORY_CARE_PROVIDER_SITE_OTHER): Payer: BC Managed Care – PPO | Admitting: Gastroenterology

## 2021-03-28 ENCOUNTER — Encounter: Payer: Self-pay | Admitting: Gastroenterology

## 2021-03-28 VITALS — BP 110/68 | HR 88 | Ht 63.0 in | Wt 213.5 lb

## 2021-03-28 DIAGNOSIS — R1013 Epigastric pain: Secondary | ICD-10-CM | POA: Diagnosis not present

## 2021-03-28 DIAGNOSIS — Z1212 Encounter for screening for malignant neoplasm of rectum: Secondary | ICD-10-CM | POA: Diagnosis not present

## 2021-03-28 DIAGNOSIS — K219 Gastro-esophageal reflux disease without esophagitis: Secondary | ICD-10-CM

## 2021-03-28 DIAGNOSIS — Z1211 Encounter for screening for malignant neoplasm of colon: Secondary | ICD-10-CM

## 2021-03-28 MED ORDER — PANTOPRAZOLE SODIUM 40 MG PO TBEC: 40.0000 mg | DELAYED_RELEASE_TABLET | Freq: Two times a day (BID) | ORAL | 1 refills | Status: DC

## 2021-03-28 MED ORDER — SUTAB 1479-225-188 MG PO TABS: 1.0000 | ORAL_TABLET | Freq: Once | ORAL | 0 refills | Status: AC

## 2021-03-28 NOTE — Progress Notes (Signed)
HPI : Jasmine Spencer is a very pleasant 56 year old female referred to [place] by Dr. Rochel Brome for further evaluation of suspected GERD symptoms.  She states that she has had these symptoms intermittently for many years, but they have been much more frequent and bothersome the past year.  She describes a crampy pain in her chest and upper abdomen which occurs throughout the day.  It is not described as a burning sensation.  No nausea or vomiting.  Denies frequent acid regurgitation but sometimes get an 'ache' in the back of the throat.  Weight has been stable. The pain is not associated with physical activity.  It is not typically related to meals and is not worse at night or in a supine position.  These symptoms don't seem to vary much with her diet.  She started Protonix about 2 weeks ago and it has helped reduce the severity of the pain.   She has regular bowel movements, denies problems with constipation, diarrhea or hematochezia.  No family history of GI malignancy.  She has never had a colonoscopy.  She reports having an EGD in the 1990s to evaluate this chest pain and remembers being told she had a hiatal hernia and being prescribed Prilosec.   Past Medical History:  Diagnosis Date   Arthritis    R knee   Complication of anesthesia    Pt. has panicky feeling  ( feels like she can't breathe)upon awaking fr. anesth. , NEEDS TO BE SITTING UPRIGHT   Elevated cholesterol    Family history of anesthesia complication    pt.'s father is very agitated /w MORPHINE, can tolerate DILAUDID   Gallstones    H/O hiatal hernia    h/o - several yrs. ago, took Prilosec for 1 yr., no longer a problem    Headache(784.0)    migraine- hormone related      Past Surgical History:  Procedure Laterality Date   ADENOIDECTOMY     as a child   CESAREAN SECTION  2002, 2007   x 2   CHOLECYSTECTOMY  09/20/2020   ENDOMETRIAL ABLATION  04/04/2014   [facility]   KNEE ARTHROSCOPY Right [phone]   SHOULDER  ARTHROSCOPY WITH ROTATOR CUFF REPAIR AND OPEN BICEPS TENODESIS Right [phone]   X 2   TOTAL KNEE ARTHROPLASTY  09/30/2011   Procedure: TOTAL KNEE ARTHROPLASTY;  Surgeon: Ninetta Lights, MD;  Location: [place];  Service: Orthopedics;  Laterality: Right;   TOTAL KNEE ARTHROPLASTY Left 01/27/2018   Family History  Problem Relation Age of Onset   Diabetes Mother    Hypertension Mother    Melanoma Mother    Heart disease Father    Leukemia Father        CLL   Heart attack Father    Hypertension Father    Heart attack Sister    Heart disease Sister    Diabetes Sister    Heart attack Brother    Heart disease Brother    Heart disease Brother    Epilepsy Brother    Diabetes Maternal Grandmother    Breast cancer Maternal Aunt    Social History   Tobacco Use   Smoking status: Never   Smokeless tobacco: Never  Vaping Use   Vaping Use: Never used  Substance Use Topics   Alcohol use: Not Currently    Comment: rare   Drug use: No   Current Outpatient Medications  Medication Sig Dispense Refill   pantoprazole (PROTONIX) 40 MG tablet Take  1 tablet (40 mg total) by mouth daily. 30 tablet 3   valACYclovir (VALTREX) 1000 MG tablet TAKE ONE TABLET BY MOUTH TWICE DAILY FOR 3 TO 5 DAYS FOR ONSET OUTBREAK. (Patient not taking: Reported on 03/28/2021) 10 tablet 3   No current facility-administered medications for this visit.   Allergies  Allergen Reactions   Milk-Related Compounds Other (See Comments)    Lactose intolerance.   Penicillins Other (See Comments)    Childhood allergy.   Latex Rash     Review of Systems: All systems reviewed and negative except where noted in HPI.    No results found.  Physical Exam: BP 110/68 (BP Location: Left Arm, Patient Position: Sitting, Cuff Size: Normal)    Pulse 88    Ht 5\' 3"  (1.6 m)    Wt 213 lb 8 oz (96.8 kg)    BMI 37.82 kg/m  Constitutional: Pleasant,well-developed, Caucasian female in no acute distress. HEENT: Normocephalic and  atraumatic. Conjunctivae are normal. No scleral icterus. Neck supple.  Cardiovascular: Normal rate, regular rhythm.  Pulmonary/chest: Effort normal and breath sounds normal. No wheezing, rales or rhonchi. Abdominal: Soft, nondistended, nontender. Bowel sounds active throughout. There are no masses palpable. No hepatomegaly. Extremities: no edema Neurological: Alert and oriented to person place and time. Skin: Skin is warm and dry. No rashes noted. Psychiatric: Normal mood and affect. Behavior is normal.  CBC    Component Value Date/Time   WBC 7.8 03/17/2021 1452   WBC 10.1 08/14/2020 0351   RBC 4.37 03/17/2021 1452   RBC 4.66 08/14/2020 0351   HGB 13.2 03/17/2021 1452   HCT 37.7 03/17/2021 1452   PLT 250 03/17/2021 1452   MCV 86 03/17/2021 1452   MCH 30.2 03/17/2021 1452   MCH 29.6 08/14/2020 0351   MCHC 35.0 03/17/2021 1452   MCHC 34.5 08/14/2020 0351   RDW 12.5 03/17/2021 1452   LYMPHSABS 2.5 03/17/2021 1452   MONOABS 0.6 08/14/2020 0351   EOSABS 0.3 03/17/2021 1452   BASOSABS 0.0 03/17/2021 1452    CMP     Component Value Date/Time   NA 137 03/17/2021 1452   K 4.4 03/17/2021 1452   CL 101 03/17/2021 1452   CO2 22 03/17/2021 1452   GLUCOSE 82 03/17/2021 1452   GLUCOSE 139 (H) 08/14/2020 0351   BUN 10 03/17/2021 1452   CREATININE 0.74 03/17/2021 1452   CREATININE 0.71 12/19/2019 0837   CALCIUM 9.7 03/17/2021 1452   PROT 7.1 03/17/2021 1452   ALBUMIN 4.6 03/17/2021 1452   AST 11 03/17/2021 1452   ALT 14 03/17/2021 1452   ALKPHOS 77 03/17/2021 1452   BILITOT 0.3 03/17/2021 1452   GFRNONAA >60 08/14/2020 0351   GFRAA >60 05/10/2017 1617     ASSESSMENT AND PLAN: 56 year old female with atypical chest pain.  Her description of the pain is not typical for GERD, but the partial response to PPI makes it more likely to be GERD than any other etiology.  We discussed the pathophysiology of GERD and the principles of GERD management to include lifestyle modifications   such as dietary discretion (avoidance of alcohol, tobacco, caffeinated and carbonated beverages, spicy/greasy foods, citrus, peppermint/chocolate), weight loss if applicable, head of bed elevation andconsuming last meal of day within 3 hours of bedtime; pharmacologic options to include PPIs, H2RAs and OTC antacids; and finally surgical or endoscopic fundoplication. An EGD is reasonable given persistent symptoms despite PPI use.  In the meantime, recommended she increase her Protonix to twice a day,  30-45 min before meals. She is also overdue for her initial average risk screening colonoscopy.  Will schedule today.  Atypical chest pain, suspect GERD - Increase Protonix to BID - EGD  CRC screening - Colonoscopy  The details, risks (including bleeding, perforation, infection, missed lesions, medication reactions and possible hospitalization or surgery if complications occur), benefits, and alternatives to EGD/colonoscopy with possible biopsy and possible polypectomy were discussed with the patient and she consents to proceed.   Jamara Vary E. Candis Schatz, MD [facility]   CC:  Rochel Brome, MD

## 2021-03-28 NOTE — Patient Instructions (Signed)
If you are age 55 or older, your body mass index should be between 23-30. Your Body mass index is 37.82 kg/m. If this is out of the aforementioned range listed, please consider follow up with your Primary Care Provider.  If you are age 8 or younger, your body mass index should be between 19-25. Your Body mass index is 37.82 kg/m. If this is out of the aformentioned range listed, please consider follow up with your Primary Care Provider.   We have sent the following medications to your pharmacy for you to pick up at your convenience: Pantoprazole 40 mg twice daily.  You have been scheduled for an endoscopy and colonoscopy. Please follow the written instructions given to you at your visit today. Please pick up your prep supplies at the pharmacy within the next 1-3 days. If you use inhalers (even only as needed), please bring them with you on the day of your procedure.   The [facility] providers would like to encourage you to use [facility] to communicate with providers for non-urgent requests or questions.  Due to long hold times on the telephone, sending your provider a message by [facility] may be a faster and more efficient way to get a response.  Please allow 48 business hours for a response.  Please remember that this is for non-urgent requests.   It was a pleasure to see you today!  Thank you for trusting me with your gastrointestinal care!    Scott E.Candis Schatz, MD

## 2021-03-30 ENCOUNTER — Encounter: Payer: Self-pay | Admitting: Gastroenterology

## 2021-05-14 ENCOUNTER — Encounter: Payer: BC Managed Care – PPO | Admitting: Gastroenterology

## 2021-06-09 ENCOUNTER — Encounter: Payer: Self-pay | Admitting: Gastroenterology

## 2021-06-16 ENCOUNTER — Encounter: Payer: Self-pay | Admitting: Gastroenterology

## 2021-06-16 ENCOUNTER — Ambulatory Visit (AMBULATORY_SURGERY_CENTER): Payer: BC Managed Care – PPO | Admitting: Gastroenterology

## 2021-06-16 VITALS — BP 125/88 | HR 72 | Temp 97.8°F | Resp 12 | Ht 63.0 in | Wt 213.0 lb

## 2021-06-16 DIAGNOSIS — K319 Disease of stomach and duodenum, unspecified: Secondary | ICD-10-CM | POA: Diagnosis not present

## 2021-06-16 DIAGNOSIS — D120 Benign neoplasm of cecum: Secondary | ICD-10-CM | POA: Diagnosis not present

## 2021-06-16 DIAGNOSIS — D125 Benign neoplasm of sigmoid colon: Secondary | ICD-10-CM

## 2021-06-16 DIAGNOSIS — D131 Benign neoplasm of stomach: Secondary | ICD-10-CM | POA: Diagnosis not present

## 2021-06-16 DIAGNOSIS — K219 Gastro-esophageal reflux disease without esophagitis: Secondary | ICD-10-CM | POA: Diagnosis not present

## 2021-06-16 DIAGNOSIS — Z1211 Encounter for screening for malignant neoplasm of colon: Secondary | ICD-10-CM

## 2021-06-16 DIAGNOSIS — Z1212 Encounter for screening for malignant neoplasm of rectum: Secondary | ICD-10-CM | POA: Diagnosis not present

## 2021-06-16 DIAGNOSIS — D123 Benign neoplasm of transverse colon: Secondary | ICD-10-CM

## 2021-06-16 DIAGNOSIS — D122 Benign neoplasm of ascending colon: Secondary | ICD-10-CM

## 2021-06-16 DIAGNOSIS — K317 Polyp of stomach and duodenum: Secondary | ICD-10-CM | POA: Diagnosis not present

## 2021-06-16 MED ORDER — SODIUM CHLORIDE 0.9 % IV SOLN: 500.0000 mL | Freq: Once | INTRAVENOUS | Status: DC

## 2021-06-16 NOTE — Progress Notes (Signed)
Called to room to assist during endoscopic procedure.  Patient ID and intended procedure confirmed with present staff. Received instructions for my participation in the procedure from the performing physician.  

## 2021-06-16 NOTE — Progress Notes (Signed)
[facility] History and Physical ? ? ?Primary Care Physician:  Marge Duncans, PA-C ? ? ?Reason for Procedure:   GERD symptoms despite PPI use, colon cancer screening ? ?Plan:    EGD, screening colonoscopy ? ? ? ? ?HPI: Jasmine Spencer is a 56 y.o. female undergoing initial average risk screening colonoscopy.  She also has chronic GERD symptoms despite PPI use.  She has no family history of colon cancer and no chronic lower GI symptoms.  ? ? ?Past Medical History:  ?Diagnosis Date  ? 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 Complication of anesthesia   ? Pt. has panicky feeling  ( feels like she can't breathe)upon awaking fr. anesth. , NEEDS TO BE SITTING UPRIGHT  ? Elevated cholesterol   ? Family history of anesthesia complication   ? pt.'s father is very agitated /w MORPHINE, can tolerate DILAUDID  ? Gallstones   ? H/O hiatal hernia   ? h/o - several yrs. ago, took Prilosec for 1 yr., no longer a problem   ? Headache(784.0)   ? migraine- hormone related   ? ? ?Past Surgical History:  ?Procedure Laterality Date  ? ADENOIDECTOMY    ? as a child  ? CESAREAN SECTION  2002, 2007  ? x 2  ? CHOLECYSTECTOMY  09/20/2020  ? ENDOMETRIAL ABLATION  04/04/2014  ? [facility]  ? KNEE ARTHROSCOPY Right [id number]  ? SHOULDER ARTHROSCOPY WITH ROTATOR CUFF REPAIR AND OPEN BICEPS TENODESIS Right 3614,4315  ? X 2  ? TOTAL KNEE ARTHROPLASTY  09/30/2011  ? Procedure: TOTAL KNEE ARTHROPLASTY;  Surgeon: Ninetta Lights, MD;  Location: [facility];  Service: Orthopedics;  Laterality: Right;  ? TOTAL KNEE ARTHROPLASTY Left 01/27/2018  ? ? ?Prior to Admission medications   ?Medication Sig Start Date End Date Taking? Authorizing Provider  ?valACYclovir (VALTREX) 1000 MG tablet TAKE ONE TABLET BY MOUTH TWICE DAILY FOR 3 TO 5 DAYS FOR ONSET OUTBREAK. 08/12/20  Yes Marny Lowenstein A, NP  ?pantoprazole (PROTONIX) 40 MG tablet Take 1 tablet (40 mg total) by mouth 2 (two) times daily. ?Patient not taking: Reported on 06/16/2021 03/28/21   Daryel November,  MD  ? ? ?Current Outpatient Medications  ?Medication Sig Dispense Refill  ? valACYclovir (VALTREX) 1000 MG tablet TAKE ONE TABLET BY MOUTH TWICE DAILY FOR 3 TO 5 DAYS FOR ONSET OUTBREAK. 10 tablet 3  ? pantoprazole (PROTONIX) 40 MG tablet Take 1 tablet (40 mg total) by mouth 2 (two) times daily. (Patient not taking: Reported on 06/16/2021) 120 tablet 1  ? ?Current Facility-Administered Medications  ?Medication Dose Route Frequency Provider Last Rate Last Admin  ? 0.9 %  sodium chloride infusion  500 mL Intravenous Once Daryel November, MD      ? ? ?Allergies as of 06/16/2021 - Review Complete 06/16/2021  ?Allergen Reaction Noted  ? Milk-related compounds Other (See Comments) 11/06/2010  ? Penicillins Other (See Comments) 11/06/2010  ? Latex Rash 11/12/2010  ? ? ?Family History  ?Problem Relation Age of Onset  ? Diabetes Mother   ? Hypertension Mother   ? Melanoma Mother   ? Heart disease Father   ? Leukemia Father   ?     CLL  ? Heart attack Father   ? Hypertension Father   ? Heart attack Sister   ? Heart disease Sister   ? Diabetes Sister   ? Heart attack Brother   ? Heart disease Brother   ? Heart disease Brother   ? Epilepsy Brother   ?  Breast cancer Maternal Aunt   ? Diabetes Maternal Grandmother   ? Colon cancer Neg Hx   ? Esophageal cancer Neg Hx   ? Rectal cancer Neg Hx   ? Stomach cancer Neg Hx   ? ? ?Social History  ? ?Socioeconomic History  ? Marital status: Significant Other  ?  Spouse name: Not on file  ? Number of children: 2  ? Years of education: Not on file  ? Highest education level: Not on file  ?Occupational History  ? Not on file  ?Tobacco Use  ? Smoking status: Never  ? Smokeless tobacco: Never  ?Vaping Use  ? Vaping Use: Never used  ?Substance and Sexual Activity  ? Alcohol use: Not Currently  ?  Comment: rare  ? Drug use: No  ? Sexual activity: Not Currently  ?  Comment: declined insurance questions  ?Other Topics Concern  ? Not on file  ?Social History Narrative  ? Not on file  ? ?Social  Determinants of Health  ? ?Financial Resource Strain: Not on file  ?Food Insecurity: Not on file  ?Transportation Needs: Not on file  ?Physical Activity: Not on file  ?Stress: Not on file  ?Social Connections: Not on file  ?Intimate Partner Violence: Not on file  ? ? ?Review of Systems: ? ?All other review of systems negative except as mentioned in the HPI. ? ?Physical Exam: ?Vital signs ?BP 121/67   Pulse 72   Temp 97.8 ?F (36.6 ?C) (Temporal)   Ht '5\' 3"'$  (1.6 m)   Wt 213 lb (96.6 kg)   SpO2 96%   BMI 37.73 kg/m?  ? ?General:   Alert,  Well-developed, well-nourished, pleasant and cooperative in NAD ?Airway:  Mallampati 2 ?Lungs:  Clear throughout to auscultation.   ?Heart:  Regular rate and rhythm; no murmurs, clicks, rubs,  or gallops. ?Abdomen:  Soft, nontender and nondistended. Normal bowel sounds.   ?Neuro/Psych:  Normal mood and affect. A and O x 3 ? ? ?Jayshawn Colston E. Candis Schatz, MD ?[facility] ? ?

## 2021-06-16 NOTE — Progress Notes (Signed)
Sedate, gd SR, tolerated procedure well, VSS, report to RN 

## 2021-06-16 NOTE — Progress Notes (Signed)
1130 IV infiltrated, pt sedate but awake, IV re-initiated into L hand, sedation re-started at 1134 ?

## 2021-06-16 NOTE — Op Note (Signed)
[place] ?Patient Name: Jasmine Spencer ?Procedure Date: 06/16/2021 11:01 AM ?MRN: [id number] ?Endoscopist: Ashima Shrake E. Candis Schatz , MD ?Age: 56 ?Referring MD:  ?Date of Birth: [birth year] ?Gender: Female ?Account #: [id number] ?Procedure:                Colonoscopy ?Indications:              Screening for colorectal malignant neoplasm, This  ?                          is the patient's first colonoscopy ?Medicines:                Monitored Anesthesia Care ?Procedure:                Pre-Anesthesia Assessment: ?                          - Prior to the procedure, a History and Physical  ?                          was performed, and patient medications and  ?                          allergies were reviewed. The patient's tolerance of  ?                          previous anesthesia was also reviewed. The risks  ?                          and benefits of the procedure and the sedation  ?                          options and risks were discussed with the patient.  ?                          All questions were answered, and informed consent  ?                          was obtained. Prior Anticoagulants: The patient has  ?                          taken no previous anticoagulant or antiplatelet  ?                          agents. ASA Grade Assessment: III - A patient with  ?                          severe systemic disease. After reviewing the risks  ?                          and benefits, the patient was deemed in  ?                          satisfactory condition to undergo the procedure. ?  After obtaining informed consent, the colonoscope  ?                          was passed under direct vision. Throughout the  ?                          procedure, the patient's blood pressure, pulse, and  ?                          oxygen saturations were monitored continuously. The  ?                          CF [id number] #[id number] was introduced through the anus  ?                          and advanced  to the the terminal ileum, with  ?                          identification of the appendiceal orifice and IC  ?                          valve. The colonoscopy was performed without  ?                          difficulty. The patient tolerated the procedure  ?                          well. The quality of the bowel preparation was  ?                          adequate. The terminal ileum, ileocecal valve,  ?                          appendiceal orifice, and rectum were photographed.  ?                          The bowel preparation used was SUPREP via split  ?                          dose instruction. ?Scope In: 11:22:18 AM ?Scope Out: 11:46:00 AM ?Scope Withdrawal Time: 0 hours 18 minutes 47 seconds  ?Total Procedure Duration: 0 hours 23 minutes 42 seconds  ?Findings:                 The perianal and digital rectal examinations were  ?                          normal. Pertinent negatives include normal  ?                          sphincter tone and no palpable rectal lesions. ?                          A 3 mm polyp was found in the cecum. The polyp was  ?  sessile. The polyp was removed with a cold snare.  ?                          Resection and retrieval were complete. Estimated  ?                          blood loss was minimal. ?                          A 2 mm polyp was found in the ascending colon. The  ?                          polyp was sessile. The polyp was removed with a  ?                          cold snare. Resection and retrieval were complete.  ?                          Estimated blood loss was minimal. ?                          A 5 mm polyp was found in the transverse colon. The  ?                          polyp was sessile. The polyp was removed with a  ?                          cold snare. Resection and retrieval were complete.  ?                          Estimated blood loss was minimal. ?                          A 3 mm polyp was found in the sigmoid colon. The  ?                           polyp was sessile. The polyp was removed with a  ?                          cold snare. Resection and retrieval were complete.  ?                          Estimated blood loss was minimal. ?                          A single small angioectasia without bleeding was  ?                          found in the transverse colon. ?                          Multiple small and large-mouthed diverticula were  ?  found in the sigmoid colon and descending colon.  ?                          There was no evidence of diverticular bleeding. ?                          The exam was otherwise normal throughout the  ?                          examined colon. ?                          The terminal ileum appeared normal. ?                          The retroflexed view of the distal rectum and anal  ?                          verge was normal and showed no anal or rectal  ?                          abnormalities. ?Complications:            No immediate complications. ?Estimated Blood Loss:     Estimated blood loss was minimal. ?Impression:               - One 3 mm polyp in the cecum, removed with a cold  ?                          snare. Resected and retrieved. ?                          - One 2 mm polyp in the ascending colon, removed  ?                          with a cold snare. Resected and retrieved. ?                          - One 5 mm polyp in the transverse colon, removed  ?                          with a cold snare. Resected and retrieved. ?                          - One 3 mm polyp in the sigmoid colon, removed with  ?                          a cold snare. Resected and retrieved. ?                          - A single non-bleeding colonic angioectasia. ?                          - Moderate diverticulosis in the sigmoid colon and  ?  in the descending colon. There was no evidence of  ?                          diverticular bleeding. ?                           - The examined portion of the ileum was normal. ?                          - The distal rectum and anal verge are normal on  ?                          retroflexion view. ?Recommendation:           - Patient has a contact number available for  ?                          emergencies. The signs and symptoms of potential  ?                          delayed complications were discussed with the  ?                          patient. Return to normal activities tomorrow.  ?                          Written discharge instructions were provided to the  ?                          patient. ?                          - Resume previous diet. ?                          - Continue present medications. ?                          - Await pathology results. ?                          - Repeat colonoscopy (date not yet determined) for  ?                          surveillance based on pathology results. ?Sirus Labrie E. Candis Schatz, MD ?06/16/2021 12:05:49 PM ?This report has been signed electronically. ?

## 2021-06-16 NOTE — Op Note (Signed)
[place] ?Patient Name: Jasmine Spencer ?Procedure Date: 06/16/2021 11:02 AM ?MRN: [id number] ?Endoscopist: Florabel Faulks E. Candis Schatz , MD ?Age: 56 ?Referring MD:  ?Date of Birth: 07/06/1965 ?Gender: Female ?Account #: [id number] ?Procedure:                Upper GI endoscopy ?Indications:              Esophageal reflux symptoms that persist despite  ?                          appropriate therapy ?Medicines:                Monitored Anesthesia Care ?Procedure:                Pre-Anesthesia Assessment: ?                          - Prior to the procedure, a History and Physical  ?                          was performed, and patient medications and  ?                          allergies were reviewed. The patient's tolerance of  ?                          previous anesthesia was also reviewed. The risks  ?                          and benefits of the procedure and the sedation  ?                          options and risks were discussed with the patient.  ?                          All questions were answered, and informed consent  ?                          was obtained. Prior Anticoagulants: The patient has  ?                          taken no previous anticoagulant or antiplatelet  ?                          agents. ASA Grade Assessment: III - A patient with  ?                          severe systemic disease. After reviewing the risks  ?                          and benefits, the patient was deemed in  ?                          satisfactory condition to undergo the procedure. ?  After obtaining informed consent, the endoscope was  ?                          passed under direct vision. Throughout the  ?                          procedure, the patient's blood pressure, pulse, and  ?                          oxygen saturations were monitored continuously. The  ?                          GIF HQ190 #[id number] was introduced through the  ?                          mouth, and advanced to the  third part of duodenum.  ?                          The upper GI endoscopy was accomplished without  ?                          difficulty. The patient tolerated the procedure  ?                          well. ?Scope In: ?Scope Out: ?Findings:                 The examined esophagus was normal. ?                          Multiple 2 to 10 mm sessile polyps were found in  ?                          the gastric body. These polyps were removed with a  ?                          cold snare. Resection and retrieval were complete.  ?                          Estimated blood loss was minimal. ?                          Striped mildly erythematous mucosa without bleeding  ?                          was found in the gastric antrum. Biopsies were  ?                          taken with a cold forceps for Helicobacter pylori  ?                          testing. Estimated blood loss was minimal. ?                          The examined  duodenum was normal. ?Complications:            No immediate complications. ?Estimated Blood Loss:     Estimated blood loss was minimal. ?Impression:               - Normal esophagus. ?                          - Multiple gastric polyps. Resected and retrieved. ?                          - Erythematous mucosa in the antrum. Biopsied. ?                          - Normal examined duodenum. ?Recommendation:           - Patient has a contact number available for  ?                          emergencies. The signs and symptoms of potential  ?                          delayed complications were discussed with the  ?                          patient. Return to normal activities tomorrow.  ?                          Written discharge instructions were provided to the  ?                          patient. ?                          - Resume previous diet. ?                          - Continue present medications. ?                          - Await pathology results. ?Keiry Kowal E. Candis Schatz, MD ?06/16/2021 11:58:02  AM ?This report has been signed electronically. ?

## 2021-06-16 NOTE — Patient Instructions (Addendum)
Handouts provided on polyps and diverticulosis.  ? ?YOU HAD AN ENDOSCOPIC PROCEDURE TODAY AT [facility]:   Refer to the procedure report that was given to you for any specific questions about what was found during the examination.  If the procedure report does not answer your questions, please call your gastroenterologist to clarify.  If you requested that your care partner not be given the details of your procedure findings, then the procedure report has been included in a sealed envelope for you to review at your convenience later. ? ?YOU SHOULD EXPECT: Some feelings of bloating in the abdomen. Passage of more gas than usual.  Walking can help get rid of the air that was put into your GI tract during the procedure and reduce the bloating. If you had a lower endoscopy (such as a colonoscopy or flexible sigmoidoscopy) you may notice spotting of blood in your stool or on the toilet paper. If you underwent a bowel prep for your procedure, you may not have a normal bowel movement for a few days. ? ?Please Note:  You might notice some irritation and congestion in your nose or some drainage.  This is from the oxygen used during your procedure.  There is no need for concern and it should clear up in a day or so. ? ?SYMPTOMS TO REPORT IMMEDIATELY: ? ?Following lower endoscopy (colonoscopy or flexible sigmoidoscopy): ? Excessive amounts of blood in the stool ? Significant tenderness or worsening of abdominal pains ? Swelling of the abdomen that is new, acute ? Fever of 100?F or higher ? ?Following upper endoscopy (EGD) ? Vomiting of blood or coffee ground material ? New chest pain or pain under the shoulder blades ? Painful or persistently difficult swallowing ? New shortness of breath ? Fever of 100?F or higher ? Black, tarry-looking stools ? ?For urgent or emergent issues, a gastroenterologist can be reached at any hour by calling [phone]. ?Do not use MyChart messaging for urgent concerns.   ? ? ?DIET:  We do recommend a small meal at first, but then you may proceed to your regular diet.  Drink plenty of fluids but you should avoid alcoholic beverages for 24 hours. ? ?ACTIVITY:  You should plan to take it easy for the rest of today and you should NOT DRIVE or use heavy machinery until tomorrow (because of the sedation medicines used during the test).   ? ?FOLLOW UP: ?Our staff will call the number listed on your records 48-72 hours following your procedure to check on you and address any questions or concerns that you may have regarding the information given to you following your procedure. If we do not reach you, we will leave a message.  We will attempt to reach you two times.  During this call, we will ask if you have developed any symptoms of COVID 19. If you develop any symptoms (ie: fever, flu-like symptoms, shortness of breath, cough etc.) before then, please call [phone].  If you test positive for Covid 19 in the 2 weeks post procedure, please call and report this information to [place].   ? ?If any biopsies were taken you will be contacted by phone or by letter within the next 1-3 weeks.  Please call us at [phone] if you have not heard about the biopsies in 3 weeks.  ? ? ?SIGNATURES/CONFIDENTIALITY: ?You and/or your care partner have signed paperwork which will be entered into your electronic medical record.  These signatures attest to the fact  that that the information above on your After Visit Summary has been reviewed and is understood.  Full responsibility of the confidentiality of this discharge information lies with you and/or your care-partner. ? ?

## 2021-06-16 NOTE — Progress Notes (Signed)
Pt's states no medical or surgical changes since previsit or office visit. 

## 2021-06-18 ENCOUNTER — Telehealth: Payer: Self-pay

## 2021-06-18 NOTE — Telephone Encounter (Signed)
?  Follow up Call- ? ? ?  06/16/2021  ? 10:18 AM  ?Call back number  ?Post procedure Call Back phone  # [phone]  ?Permission to leave phone message Yes  ?  ? ?Patient questions: ? ?Do you have a fever, pain , or abdominal swelling? No. ?Pain Score  0 * ? ?Have you tolerated food without any problems? Yes.   ? ?Have you been able to return to your normal activities? Yes.   ? ?Do you have any questions about your discharge instructions: ?Diet   No. ?Medications  No. ?Follow up visit  No. ? ?Do you have questions or concerns about your Care? No. ? ?Actions: ?* If pain score is 4 or above: ?No action needed, pain <4. ? ? ?

## 2021-06-19 NOTE — Progress Notes (Signed)
[name], ?The polyps resected from your stomach were benign fundic gland polyps.  There was no evidence of infection with Helicobacter pylori or intestinal metaplasia/dysplasia.  These types of polyps are typically secondary to acid suppression therapy and no specific follow-up required for these small, benign polyps ? ?The biopsies taken from your stomach were notable for mild reactive gastropathy which is a common finding and often related to use of certain medications (usually NSAIDs), but there was no evidence of Helicobacter pylori infection. This common finding is not felt to necessarily be a cause of any particular symptom and there is no specific treatment or further evaluation recommended. ? ?Two of the four polyps which I removed during your recent procedure were proven to be completely benign but are considered "pre-cancerous" polyps that MAY have grown into cancer if they had not been removed.  Studies shows that at least 20% of women over age 35 and 30% of men over age 59 have pre-cancerous polyps.  Based on current nationally recognized surveillance guidelines, I recommend that you have a repeat colonoscopy in 7 years.  ? ?If you develop any new rectal bleeding, abdominal pain or significant bowel habit changes, please contact me before then. ? ? ?

## 2021-07-26 IMAGING — US US ABDOMEN LIMITED
1 series · 14 of 25 positions shown · non-contrast
Comparison: 05/10/2017

CLINICAL DATA: Gallstones

EXAM:
ULTRASOUND ABDOMEN LIMITED RIGHT UPPER QUADRANT

[Series 1: us abdomen limited · 14 of 57 slices shown]
[im 1/57]
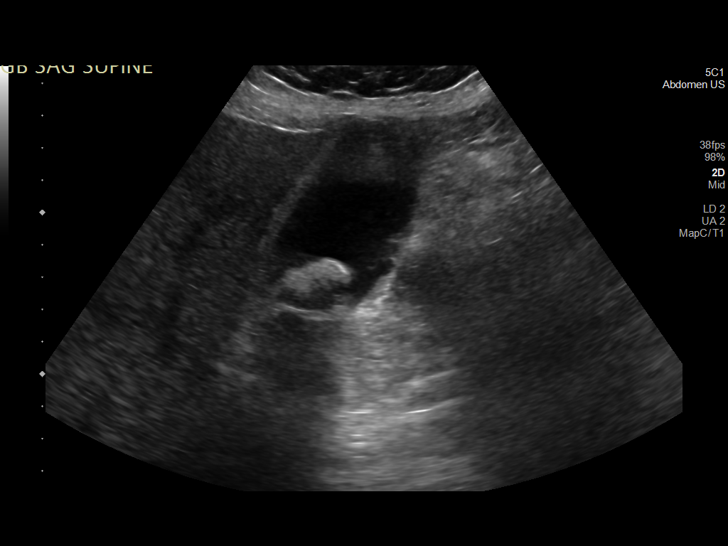
[im 5/57]
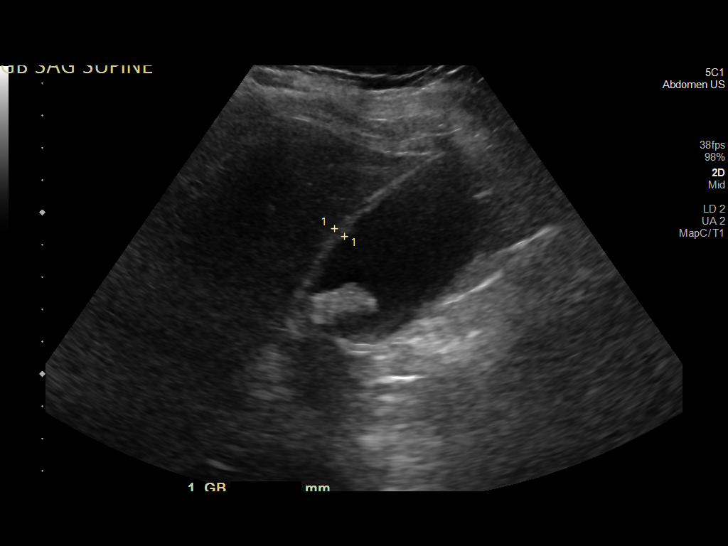
[im 10/57]
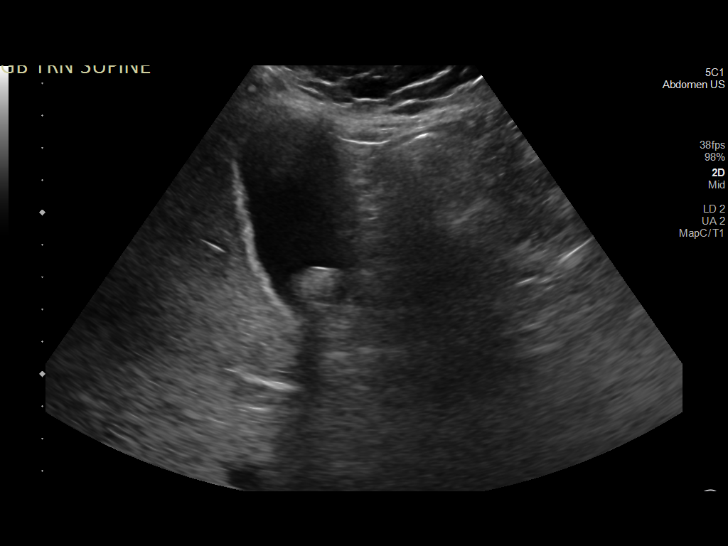
[im 15/57]
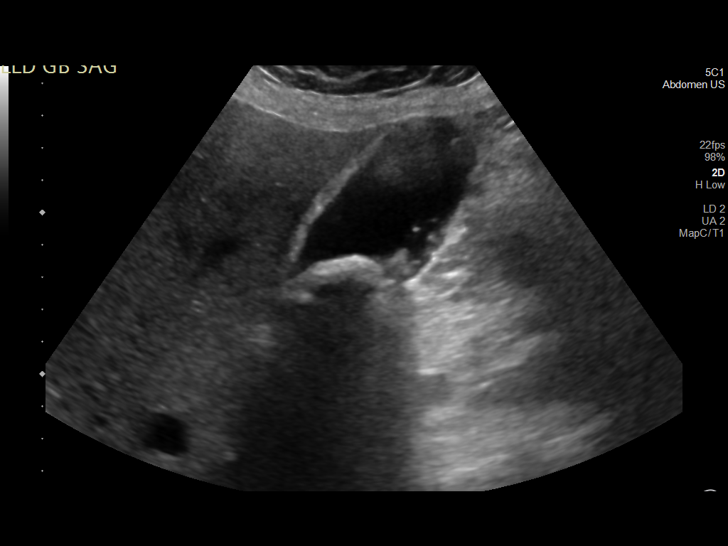
[im 19/57]
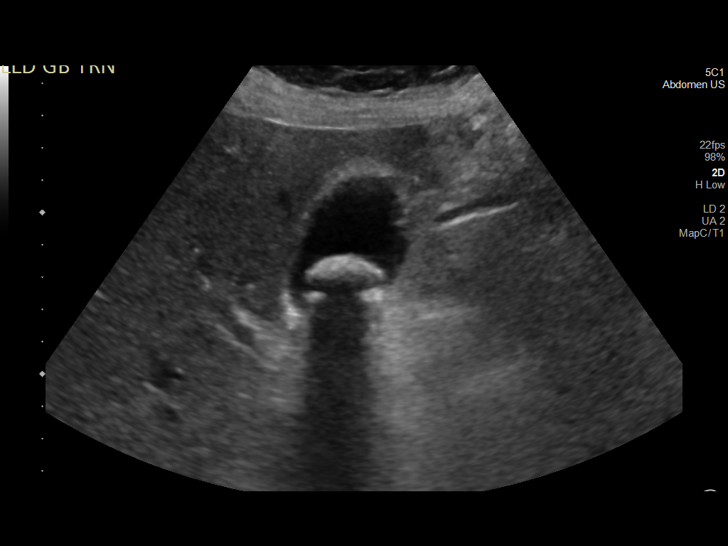
[im 22/57]
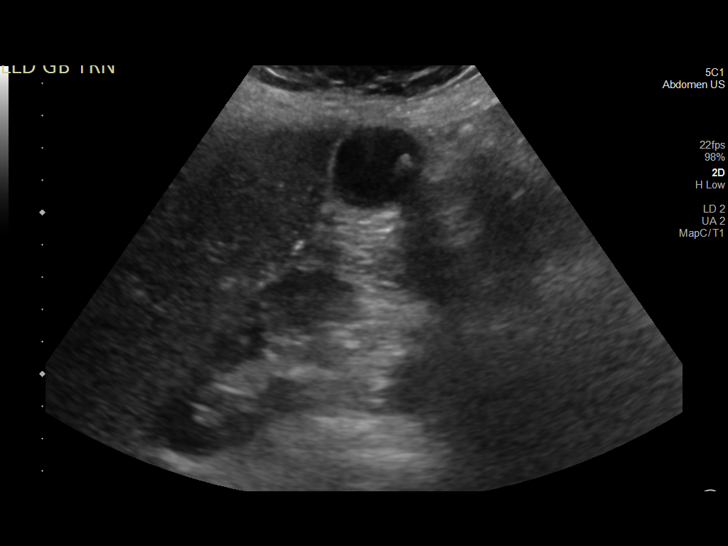
[im 26/57]
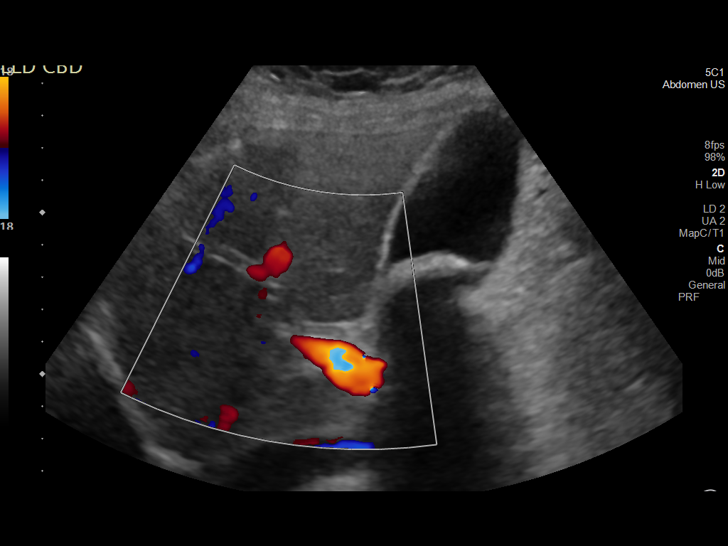
[im 31/57]
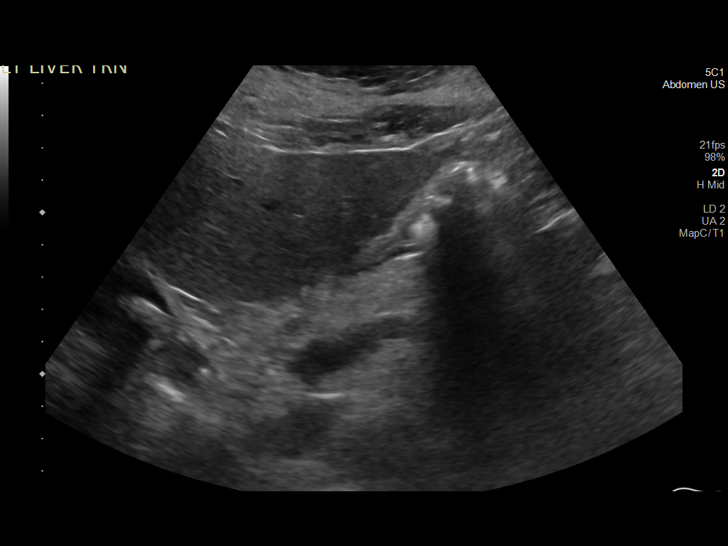
[im 36/57]
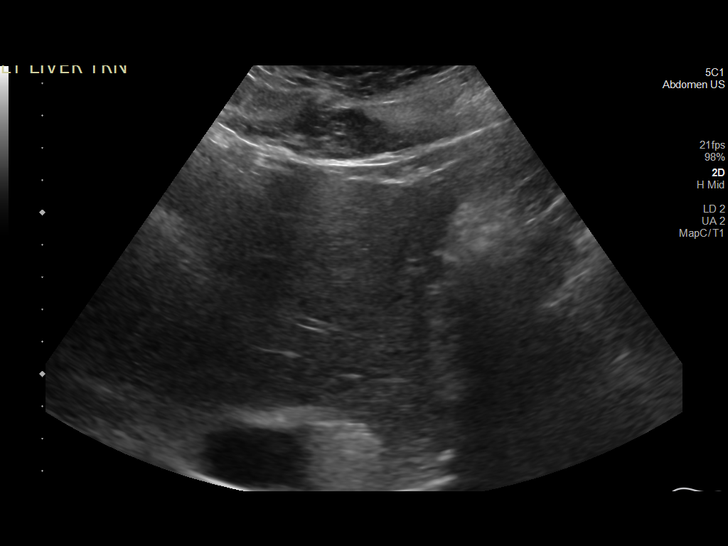
[im 38/57]
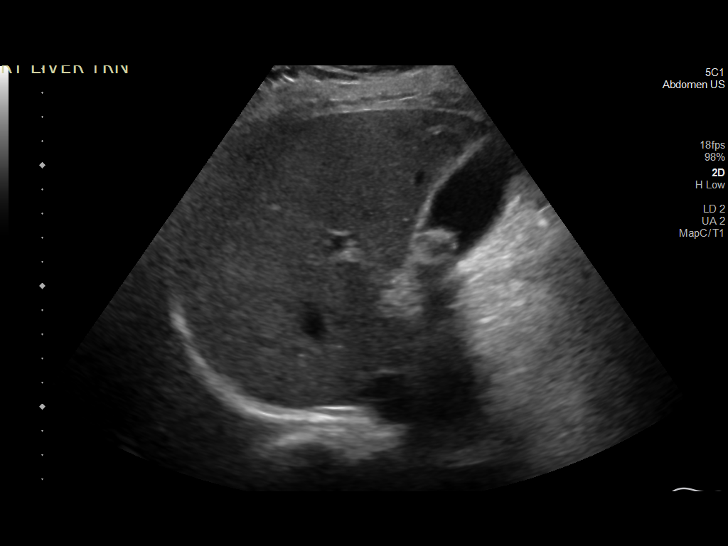
[im 43/57]
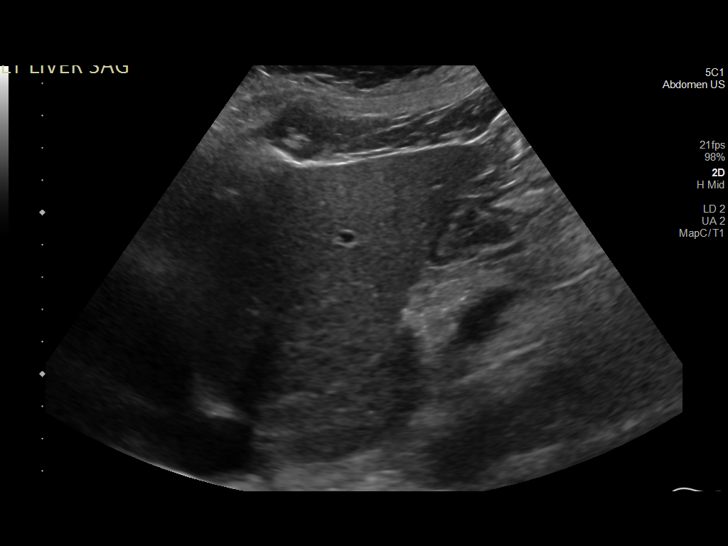
[im 47/57]
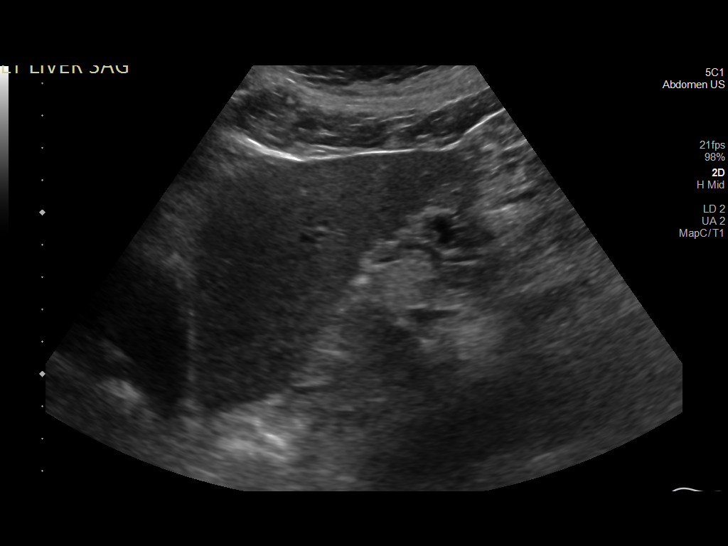
[im 52/57]
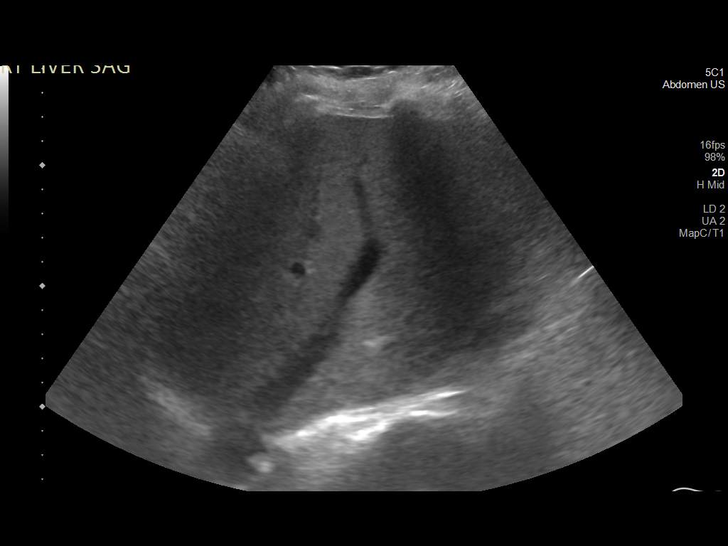
[im 57/57]
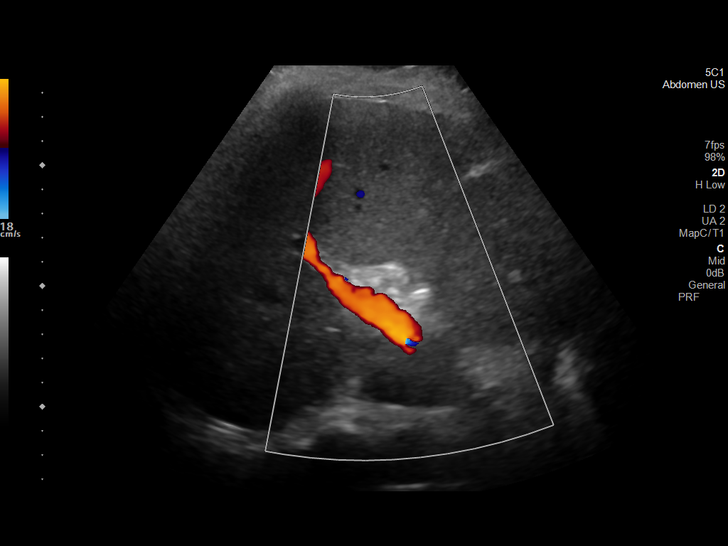

[14 of 25 positions shown; findings below may reference images not displayed]

FINDINGS: Gallbladder:

Shadowing gallstones measuring up to 2.2 cm in diameter. No wall
thickening or focal tenderness.

Common bile duct:

Diameter: 4 mm.  Where visualized, no filling defect.

Liver:

No focal lesion identified. Within normal limits in parenchymal
echogenicity. Portal vein is patent on color Doppler imaging with
normal direction of blood flow towards the liver.
IMPRESSION: 1. Cholelithiasis.
2. No acute finding.

## 2021-11-14 ENCOUNTER — Other Ambulatory Visit: Payer: Self-pay | Admitting: Nurse Practitioner

## 2021-11-14 DIAGNOSIS — B009 Herpesviral infection, unspecified: Secondary | ICD-10-CM

## 2021-11-14 NOTE — Telephone Encounter (Signed)
Last annual exam was 10/21

## 2021-11-27 ENCOUNTER — Ambulatory Visit: Payer: BC Managed Care – PPO | Admitting: Radiology

## 2021-11-27 VITALS — BP 110/70

## 2021-11-27 DIAGNOSIS — R350 Frequency of micturition: Secondary | ICD-10-CM

## 2021-11-27 NOTE — Progress Notes (Signed)
      SUBJECTIVE: Jasmine Spencer is a 56 y.o. female who complains of urinary frequency, urgency, bladder pressure, lower right back pain---x's 2 weeks.  Today's Vitals   11/27/21 1507  BP: 110/70   There is no height or weight on file to calculate BMI.   Past Medical History:  Diagnosis Date   Arthritis    R knee   Complication of anesthesia    Pt. has panicky feeling  ( feels like she can't breathe)upon awaking fr. anesth. , NEEDS TO BE SITTING UPRIGHT   Elevated cholesterol    Family history of anesthesia complication    pt.'s father is very agitated /w MORPHINE, can tolerate DILAUDID   Gallstones    H/O hiatal hernia    h/o - several yrs. ago, took Prilosec for 1 yr., no longer a problem    Headache(784.0)    migraine- hormone related     Current Outpatient Medications on File Prior to Visit  Medication Sig Dispense Refill   valACYclovir (VALTREX) 1000 MG tablet TAKE ONE TABLET BY MOUTH TWICE DAILY FOR 3 TO 5 DAYS FOR ONSET OUTBREAK. 10 tablet 3   No current facility-administered medications on file prior to visit.    OBJECTIVE: Appears well, in no apparent distress.  Vital signs are normal. The abdomen is soft without tenderness, guarding, mass, rebound or organomegaly. No CVA tenderness or inguinal adenopathy noted. Urine dipstick shows positive for RBC's.  Micro exam: 0-5 RBC's per HPF.   Chaperone offered and declined for exam.   ASSESSMENT/PLAN:  1. Urinary frequency  - Urinalysis,Complete w/RFL Culture    Will send urine culture and sensitivity.  Treatment per orders - also push fluids, avoid bladder irritants. Instructed she may use Pyridium OTC prn. Call or return to clinic prn if these symptoms worsen or fail to improve as anticipated. Pyelo precautions reviewed with patient.

## 2021-12-02 ENCOUNTER — Ambulatory Visit: Payer: BC Managed Care – PPO | Admitting: Nurse Practitioner

## 2021-12-15 ENCOUNTER — Other Ambulatory Visit (HOSPITAL_COMMUNITY)
Admission: RE | Admit: 2021-12-15 | Discharge: 2021-12-15 | Disposition: A | Discharge status: Home / Self Care | Payer: BC Managed Care – PPO | Source: Ambulatory Visit | Attending: Nurse Practitioner | Admitting: Nurse Practitioner

## 2021-12-15 ENCOUNTER — Ambulatory Visit (INDEPENDENT_AMBULATORY_CARE_PROVIDER_SITE_OTHER): Payer: BC Managed Care – PPO | Admitting: Nurse Practitioner

## 2021-12-15 ENCOUNTER — Encounter: Payer: Self-pay | Admitting: Nurse Practitioner

## 2021-12-15 VITALS — BP 104/70 | HR 75 | Ht 63.75 in | Wt 188.0 lb

## 2021-12-15 DIAGNOSIS — Z01419 Encounter for gynecological examination (general) (routine) without abnormal findings: Secondary | ICD-10-CM

## 2021-12-15 DIAGNOSIS — B009 Herpesviral infection, unspecified: Secondary | ICD-10-CM | POA: Diagnosis not present

## 2021-12-15 DIAGNOSIS — N951 Menopausal and female climacteric states: Secondary | ICD-10-CM

## 2021-12-15 MED ORDER — VALACYCLOVIR HCL 1 G PO TABS: 1000.0000 mg | ORAL_TABLET | Freq: Two times a day (BID) | ORAL | 2 refills | Status: DC

## 2021-12-15 NOTE — Progress Notes (Signed)
   [place] [birth year] [id number]   History:  56 y.o. G2P2002 presents for annual exam without GYN complaints. 2016 endometrial ablation. Having irregular cycles with mild menopausal symptoms. LMP 12/04/2021, prior to that ~9 months. Normal pap and mammogram history. HSV-1 with occasional outbreaks.  Gynecologic History Patient's last menstrual period was 12/04/2021 (exact date).   Contraception/Family planning: none Sexually active: Yes  Health Maintenance Last Pap: 09/16/2016. Results were: Normal neg HPV Last mammogram: 04/05/2020. Results were: Left breast asymmetry, normal U/S Last colonoscopy: 06/16/2021. Results were: Tubular adenomas, 7-year recall Last Dexa: Not indicated  Past medical history, past surgical history, family history and social history were all reviewed and documented in the EPIC chart. Married. HS Psychologist, prison and probation services. Son is Paramedic at [employer], plays baseball. Daughter junior in [employer].   ROS:  A ROS was performed and pertinent positives and negatives are included.  Exam:  Vitals:   12/15/21 0759  BP: 104/70  Pulse: 75  SpO2: 97%  Weight: 188 lb (85.3 kg)  Height: 5' 3.75" (1.619 m)    Body mass index is 32.52 kg/m.  General appearance:  Normal Thyroid:  Symmetrical, normal in size, without palpable masses or nodularity. Respiratory  Auscultation:  Clear without wheezing or rhonchi Cardiovascular  Auscultation:  Regular rate, without rubs, murmurs or gallops  Edema/varicosities:  Not grossly evident Abdominal  Soft,nontender, without masses, guarding or rebound.  Liver/spleen:  No organomegaly noted  Hernia:  None appreciated  Skin  Inspection:  Grossly normal Breasts: Examined lying and sitting.   Right: Without masses, retractions, nipple discharge or axillary adenopathy.   Left: Without masses, retractions, nipple discharge or axillary adenopathy. Genitourinary   Inguinal/mons:  Normal without inguinal adenopathy  External genitalia:  Normal  appearing vulva with no masses, tenderness, or lesions  BUS/Urethra/Skene's glands:  Normal  Vagina:  Normal appearing with normal color and discharge, no lesions  Cervix:  Normal appearing without discharge or lesions  Uterus:  Normal in size, shape and contour.  Midline and mobile, nontender  Adnexa/parametria:     Rt: Normal in size, without masses or tenderness.   Lt: Normal in size, without masses or tenderness.  Anus and perineum: Normal  Digital rectal exam: Normal sphincter tone without palpated masses or tenderness  Patient informed chaperone available to be present for breast and pelvic exam. Patient has requested no chaperone to be present. Patient has been advised what will be completed during breast and pelvic exam.   Assessment/Plan:  56 y.o. [id number] for annual exam.   Well female exam with routine gynecological exam - Plan: Cytology - PAP( [facility]). Education provided on SBEs, importance of preventative screenings, current guidelines, high calcium diet, regular exercise, and multivitamin daily.   Perimenopause - Having irregular cycles and some menopausal symptoms. LMP 12/04/2021, ~9 months prior to that.   HSV-1 infection - Plan: valACYclovir (VALTREX) 1000 MG tablet BID x 3-5 days at first sign of outbreak.   Screening for cervical cancer - Normal pap history. Pap today.   Screening for breast cancer - Normal mammogram history. Overdue and encouraged to schedule. Normal breast exam today.   Screening for colon cancer -  05/2021 colonoscopy. Will repeat at GI's recommended interval.   Screening for osteoporosis - Average risk. Will plan DXA at age 89.   Follow up in a year for annual.       Jasmine Spencer [facility], 8:16 AM 12/15/2021

## 2021-12-16 LAB — CYTOLOGY - PAP
Comment: NEGATIVE
Diagnosis: NEGATIVE
High risk HPV: NEGATIVE

## 2021-12-18 LAB — URINALYSIS, COMPLETE W/RFL CULTURE
Bilirubin Urine: NEGATIVE
Glucose, UA: NEGATIVE
Hyaline Cast: NONE SEEN /LPF
Ketones, ur: NEGATIVE
Leukocyte Esterase: NEGATIVE
Nitrites, Initial: NEGATIVE
Protein, ur: NEGATIVE
Specific Gravity, Urine: 1.01 (ref 1.001–1.035)
pH: 6.5 (ref 5.0–8.0)

## 2021-12-18 LAB — URINE CULTURE
MICRO NUMBER:: 14012774
SPECIMEN QUALITY:: ADEQUATE

## 2021-12-18 LAB — CULTURE INDICATED

## 2022-02-20 ENCOUNTER — Other Ambulatory Visit: Payer: Self-pay | Admitting: Nurse Practitioner

## 2022-02-20 DIAGNOSIS — Z1231 Encounter for screening mammogram for malignant neoplasm of breast: Secondary | ICD-10-CM

## 2022-04-14 ENCOUNTER — Ambulatory Visit
Admission: RE | Admit: 2022-04-14 | Discharge: 2022-04-14 | Disposition: A | Discharge status: Home / Self Care | Payer: BC Managed Care – PPO | Source: Ambulatory Visit | Attending: Nurse Practitioner | Admitting: Nurse Practitioner

## 2022-04-14 DIAGNOSIS — Z1231 Encounter for screening mammogram for malignant neoplasm of breast: Secondary | ICD-10-CM

## 2022-04-16 ENCOUNTER — Other Ambulatory Visit: Payer: Self-pay | Admitting: Nurse Practitioner

## 2022-04-16 DIAGNOSIS — R928 Other abnormal and inconclusive findings on diagnostic imaging of breast: Secondary | ICD-10-CM

## 2022-04-29 ENCOUNTER — Ambulatory Visit
Admission: RE | Admit: 2022-04-29 | Discharge: 2022-04-29 | Disposition: A | Discharge status: Home / Self Care | Payer: BC Managed Care – PPO | Source: Ambulatory Visit | Attending: Nurse Practitioner | Admitting: Nurse Practitioner

## 2022-04-29 DIAGNOSIS — R928 Other abnormal and inconclusive findings on diagnostic imaging of breast: Secondary | ICD-10-CM

## 2022-05-27 ENCOUNTER — Encounter: Payer: Self-pay | Admitting: Physician Assistant

## 2022-05-27 ENCOUNTER — Ambulatory Visit (INDEPENDENT_AMBULATORY_CARE_PROVIDER_SITE_OTHER): Payer: BC Managed Care – PPO | Admitting: Physician Assistant

## 2022-05-27 VITALS — BP 110/70 | HR 71 | Temp 97.5°F | Ht 62.5 in | Wt 181.8 lb

## 2022-05-27 DIAGNOSIS — Z0000 Encounter for general adult medical examination without abnormal findings: Secondary | ICD-10-CM | POA: Insufficient documentation

## 2022-05-27 DIAGNOSIS — M25541 Pain in joints of right hand: Secondary | ICD-10-CM

## 2022-05-27 DIAGNOSIS — Z23 Encounter for immunization: Secondary | ICD-10-CM

## 2022-05-27 DIAGNOSIS — M25542 Pain in joints of left hand: Secondary | ICD-10-CM | POA: Diagnosis not present

## 2022-05-27 LAB — POCT URINALYSIS DIP (CLINITEK)
Bilirubin, UA: NEGATIVE
Blood, UA: NEGATIVE
Glucose, UA: NEGATIVE mg/dL
Ketones, POC UA: NEGATIVE mg/dL
Leukocytes, UA: NEGATIVE
Nitrite, UA: NEGATIVE
POC PROTEIN,UA: NEGATIVE
Spec Grav, UA: 1.02 (ref 1.010–1.025)
Urobilinogen, UA: 0.2 E.U./dL
pH, UA: 6 (ref 5.0–8.0)

## 2022-05-27 MED ORDER — MELOXICAM 7.5 MG PO TABS
7.5000 mg | ORAL_TABLET | Freq: Every day | ORAL | 2 refills | Status: DC
Start: 2022-05-27 — End: 2022-10-08

## 2022-05-27 NOTE — Progress Notes (Signed)
Subjective:  Patient ID: Jasmine Spencer, female    DOB: Apr 07, 1965  Age: 57 y.o. MRN: [id number]  Chief Complaint  Patient presents with   Annual Exam    HPI Well Adult Physical: Patient here for a comprehensive physical exam.The patient reports  she has had joint aches and swelling - mostly in hands - does have some knee pains as well - does have family history  Do you take any herbs or supplements that were not prescribed by a doctor? no Are you taking calcium supplements? no Are you taking aspirin daily? no  Encounter for general adult medical examination without abnormal findings  Physical ("At Risk" items are starred): Patient's last physical exam was 1 year ago .  Patient is not afflicted from Stress Incontinence and Urge Incontinence  Patient wears a seat belts Patient has smoke detectors and has carbon monoxide detectors. Patient practices appropriate gun safety. Patient wears sunscreen with extended sun exposure. Dental Care: biannual cleanings, brushes and flosses daily. Ophthalmology/Optometry: Annual visit.  Hearing loss: none Vision impairments: none   LMP: 4 months ago Pregnancy history: G2P2 Safe at home: yes Self breast exams: yes Would like shingrix vaccine Would like to return for labwork    05/27/2022    3:24 PM 03/17/2021    2:06 PM 03/23/2015   12:20 PM  Depression screen PHQ 2/9  Decreased Interest 0 0 0  Down, Depressed, Hopeless 0 0 0  PHQ - 2 Score 0 0 0         01/23/2020    8:20 AM 05/27/2022    3:24 PM  Fall Risk  Falls in the past year?  0  Was there an injury with Fall?  0  Fall Risk Category Calculator  0  (RETIRED) Patient Fall Risk Level Low fall risk   Patient at Risk for Falls Due to  No Fall Risks  Fall risk Follow up  Falls evaluation completed             Social Hx   Social History   Socioeconomic History   Marital status: Significant Other    Spouse name: Not on file   Number of children: 2   Years of education: Not  on file   Highest education level: Not on file  Occupational History   Not on file  Tobacco Use   Smoking status: Never   Smokeless tobacco: Never  Vaping Use   Vaping Use: Never used  Substance and Sexual Activity   Alcohol use: Not Currently    Comment: rare   Drug use: No   Sexual activity: Not Currently    Comment: declined insurance questions  Other Topics Concern   Not on file  Social History Narrative   Not on file   Social Determinants of Health   Financial Resource Strain: Low Risk  (05/27/2022)   Overall Financial Resource Strain (CARDIA)    Difficulty of Paying Living Expenses: Not hard at all  Food Insecurity: No Food Insecurity (05/27/2022)   Hunger Vital Sign    Worried About Running Out of Food in the Last Year: Never true    [place] in the Last Year: Never true  Transportation Needs: No Transportation Needs (05/27/2022)   PRAPARE - Hydrologist (Medical): No    Lack of Transportation (Non-Medical): No  Physical Activity: Inactive (05/27/2022)   Exercise Vital Sign    Days of Exercise per Week: 0 days    Minutes  of Exercise per Session: 0 min  Stress: No Stress Concern Present (05/27/2022)   [place]    Feeling of Stress : Not at all  Social Connections: Moderately Integrated (05/27/2022)   Social Connection and Isolation Panel [NHANES]    Frequency of Communication with Friends and Family: More than three times a week    Frequency of Social Gatherings with Friends and Family: Three times a week    Attends Religious Services: 1 to 4 times per year    Active Member of Clubs or Organizations: No    Attends Archivist Meetings: Never    Marital Status: Living with partner   Past Medical History:  Diagnosis Date   Arthritis    R knee   Complication of anesthesia    Pt. has panicky feeling  ( feels like she can't breathe)upon awaking fr. anesth. , NEEDS  TO BE SITTING UPRIGHT   Elevated cholesterol    Family history of anesthesia complication    pt.'s father is very agitated /w MORPHINE, can tolerate DILAUDID   Gallstones    H/O hiatal hernia    h/o - several yrs. ago, took Prilosec for 1 yr., no longer a problem    Headache(784.0)    migraine- hormone related    Past Surgical History:  Procedure Laterality Date   ADENOIDECTOMY     as a child   CESAREAN SECTION  2002, 2007   x 2   CHOLECYSTECTOMY  09/20/2020   ENDOMETRIAL ABLATION  04/04/2014   [facility]   KNEE ARTHROSCOPY Right [phone]   SHOULDER ARTHROSCOPY WITH ROTATOR CUFF REPAIR AND OPEN BICEPS TENODESIS Right [id number]   X 2   TOTAL KNEE ARTHROPLASTY  09/30/2011   Procedure: TOTAL KNEE ARTHROPLASTY;  Surgeon: Ninetta Lights, MD;  Location: [place];  Service: Orthopedics;  Laterality: Right;   TOTAL KNEE ARTHROPLASTY Left 01/27/2018    Family History  Problem Relation Age of Onset   Diabetes Mother    Hypertension Mother    Melanoma Mother    Heart disease Father    Leukemia Father        CLL   Heart attack Father    Hypertension Father    Heart attack Sister    Heart disease Sister    Diabetes Sister    Heart attack Brother    Heart disease Brother    Heart disease Brother    Epilepsy Brother    Breast cancer Maternal Aunt    Diabetes Maternal Grandmother    Colon cancer Neg Hx    Esophageal cancer Neg Hx    Rectal cancer Neg Hx    Stomach cancer Neg Hx     Review of Systems  CONSTITUTIONAL: Negative for chills, fatigue, fever, unintentional weight gain and unintentional weight loss.  E/N/T: Negative for ear pain, nasal congestion and sore throat.  CARDIOVASCULAR: Negative for chest pain, dizziness, palpitations and pedal edema.  RESPIRATORY: Negative for recent cough and dyspnea.  GASTROINTESTINAL: Negative for abdominal pain, acid reflux symptoms, constipation, diarrhea, nausea and vomiting.  MSK: see HPI INTEGUMENTARY: Negative for rash.   NEUROLOGICAL: Negative for dizziness and headaches.  PSYCHIATRIC: Negative for sleep disturbance and to question depression screen.  Negative for depression, negative for anhedonia.      Objective:  PHYSICAL EXAM:   VS: BP 110/70 (BP Location: Right Arm, Patient Position: Sitting, Cuff Size: Large)   Pulse 71   Temp (!) 97.5 F (36.4 C) (Temporal)  Ht 5' 2.5" (1.588 m)   Wt 181 lb 12.8 oz (82.5 kg)   SpO2 96%   BMI 32.72 kg/m   GEN: Well nourished, well developed, in no acute distress  HEENT: normal external ears and nose - normal external auditory canals and TMS - hearing grossly normal - normal nasal mucosa and septum - Lips, Teeth and Gums - normal  Oropharynx - normal mucosa, palate, and posterior pharynx Neck: no JVD or masses - no thyromegaly Cardiac: RRR; no murmurs, rubs, or gallops,no edema - no significant varicosities Respiratory:  normal respiratory rate and pattern with no distress - normal breath sounds with no rales, rhonchi, wheezes or rubs GI: normal bowel sounds, no masses or tenderness MS: inflamed hand joints noted Skin: warm and dry, no rash  Neuro:  Alert and Oriented x 3, Strength and sensation are intact - CN II-Xii grossly intact Psych: euthymic mood, appropriate affect and demeanor  Lab Results  Component Value Date   WBC 7.8 03/17/2021   HGB 13.2 03/17/2021   HCT 37.7 03/17/2021   PLT 250 03/17/2021   GLUCOSE 82 03/17/2021   CHOL 193 12/19/2019   TRIG 143 12/19/2019   HDL 57 12/19/2019   LDLCALC 110 (H) 12/19/2019   ALT 14 03/17/2021   AST 11 03/17/2021   NA 137 03/17/2021   K 4.4 03/17/2021   CL 101 03/17/2021   CREATININE 0.74 03/17/2021   BUN 10 03/17/2021   CO2 22 03/17/2021   TSH 0.741 03/13/2015   INR 1.86 (H) 10/03/2011      Assessment & Plan:  Annual physical exam -     POCT URINALYSIS DIP (CLINITEK) -     CBC with Differential/Platelet -     TSH -     Lipid panel -     VITAMIN D 25 Hydroxy (Vit-D Deficiency,  Fractures); Future  Need for vaccination for zoster -     Varicella-zoster vaccine IM  Arthralgia of both hands -     CYCLIC CITRUL PEPTIDE ANTIBODY, IGG/IGA; Future -     Sedimentation rate; Future -     C-reactive protein; Future -     ANA w/Reflex; Future -     Uric acid; Future -     Parvovirus B19 antibody, IgG and IgM; Future -     Meloxicam; Take 1 tablet (7.5 mg total) by mouth daily.  Dispense: 30 tablet; Refill: 2     Goals   None      This is a list of the screening recommended for you and due dates:  Health Maintenance  Topic Date Due   DTaP/Tdap/Td vaccine (1 - Tdap) Never done   Zoster (Shingles) Vaccine (2 of 2) 07/22/2022   Flu Shot  09/24/2022   Mammogram  04/15/2023   Pap Smear  12/16/2026   Colon Cancer Screening  06/16/2028   HPV Vaccine  Aged Out   COVID-19 Vaccine  Discontinued   Hepatitis C Screening: USPSTF Recommendation to screen - Ages 18-79 yo.  Discontinued   HIV Screening  Discontinued     Meds ordered this encounter  Medications   meloxicam (MOBIC) 7.5 MG tablet    Sig: Take 1 tablet (7.5 mg total) by mouth daily.    Dispense:  30 tablet    Refill:  2    Order Specific Question:   Supervising Provider    AnswerShelton Silvas    Follow-up: Return in about 1 year (around 05/27/2023) for fasting physical -  Friday for labs - 3 months for shingles #2.  An After Visit Summary was printed and given to the patient.  Yetta Flock [facility] [phone]

## 2022-05-29 ENCOUNTER — Ambulatory Visit: Payer: BC Managed Care – PPO

## 2022-05-29 DIAGNOSIS — M25541 Pain in joints of right hand: Secondary | ICD-10-CM

## 2022-05-29 DIAGNOSIS — Z0000 Encounter for general adult medical examination without abnormal findings: Secondary | ICD-10-CM

## 2022-05-30 LAB — CARDIOVASCULAR RISK ASSESSMENT

## 2022-05-30 LAB — CBC WITH DIFFERENTIAL/PLATELET
Basophils Absolute: 0 10*3/uL (ref 0.0–0.2)
Basos: 0 %
EOS (ABSOLUTE): 0.2 10*3/uL (ref 0.0–0.4)
Eos: 2 %
Hematocrit: 40.7 % (ref 34.0–46.6)
Hemoglobin: 13.8 g/dL (ref 11.1–15.9)
Immature Grans (Abs): 0 10*3/uL (ref 0.0–0.1)
Immature Granulocytes: 0 %
Lymphocytes Absolute: 1.4 10*3/uL (ref 0.7–3.1)
Lymphs: 19 %
MCH: 29.8 pg (ref 26.6–33.0)
MCHC: 33.9 g/dL (ref 31.5–35.7)
MCV: 88 fL (ref 79–97)
Monocytes Absolute: 0.6 10*3/uL (ref 0.1–0.9)
Monocytes: 9 %
Neutrophils Absolute: 5.1 10*3/uL (ref 1.4–7.0)
Neutrophils: 70 %
Platelets: 278 10*3/uL (ref 150–450)
RBC: 4.63 x10E6/uL (ref 3.77–5.28)
RDW: 12.2 % (ref 11.7–15.4)
WBC: 7.4 10*3/uL (ref 3.4–10.8)

## 2022-05-30 LAB — TSH: TSH: 1.38 u[IU]/mL (ref 0.450–4.500)

## 2022-05-30 LAB — LIPID PANEL
Chol/HDL Ratio: 2.9 ratio (ref 0.0–4.4)
Cholesterol, Total: 173 mg/dL (ref 100–199)
HDL: 59 mg/dL (ref >39)
LDL Chol Calc (NIH): 100 mg/dL — ABNORMAL HIGH (ref 0–99)
Triglycerides: 77 mg/dL (ref 0–149)
VLDL Cholesterol Cal: 14 mg/dL (ref 5–40)

## 2022-06-01 ENCOUNTER — Other Ambulatory Visit: Payer: Self-pay | Admitting: Physician Assistant

## 2022-06-01 DIAGNOSIS — Z0000 Encounter for general adult medical examination without abnormal findings: Secondary | ICD-10-CM

## 2022-06-01 LAB — C-REACTIVE PROTEIN: CRP: 4 mg/L (ref 0–10)

## 2022-06-01 LAB — CYCLIC CITRUL PEPTIDE ANTIBODY, IGG/IGA: Cyclic Citrullin Peptide Ab: 6 units (ref 0–19)

## 2022-06-01 LAB — PARVOVIRUS B19 ANTIBODY, IGG AND IGM
Parvovirus B19 IgG: 0.1 index (ref 0.0–0.8)
Parvovirus B19 IgM: 0.1 index (ref 0.0–0.8)

## 2022-06-01 LAB — VITAMIN D 25 HYDROXY (VIT D DEFICIENCY, FRACTURES): Vit D, 25-Hydroxy: 27.8 ng/mL — ABNORMAL LOW (ref 30.0–100.0)

## 2022-06-01 LAB — ANA W/REFLEX: Anti Nuclear Antibody (ANA): NEGATIVE

## 2022-06-01 LAB — URIC ACID: Uric Acid: 4.6 mg/dL (ref 3.0–7.2)

## 2022-06-01 LAB — SEDIMENTATION RATE: Sed Rate: 9 mm/hr (ref 0–40)

## 2022-06-02 ENCOUNTER — Other Ambulatory Visit: Payer: Self-pay | Admitting: Physician Assistant

## 2022-06-02 DIAGNOSIS — M25541 Pain in joints of right hand: Secondary | ICD-10-CM

## 2022-06-02 DIAGNOSIS — E559 Vitamin D deficiency, unspecified: Secondary | ICD-10-CM

## 2022-06-02 LAB — COMPREHENSIVE METABOLIC PANEL
ALT: 11 IU/L (ref 0–32)
AST: 13 IU/L (ref 0–40)
Albumin/Globulin Ratio: 1.6 (ref 1.2–2.2)
Albumin: 4.2 g/dL (ref 3.8–4.9)
Alkaline Phosphatase: 70 IU/L (ref 44–121)
BUN/Creatinine Ratio: 19 (ref 9–23)
BUN: 14 mg/dL (ref 6–24)
Bilirubin Total: 0.4 mg/dL (ref 0.0–1.2)
CO2: 19 mmol/L — ABNORMAL LOW (ref 20–29)
Calcium: 9.5 mg/dL (ref 8.7–10.2)
Chloride: 103 mmol/L (ref 96–106)
Creatinine, Ser: 0.73 mg/dL (ref 0.57–1.00)
Globulin, Total: 2.6 g/dL (ref 1.5–4.5)
Glucose: 80 mg/dL (ref 70–99)
Potassium: 4.4 mmol/L (ref 3.5–5.2)
Sodium: 138 mmol/L (ref 134–144)
Total Protein: 6.8 g/dL (ref 6.0–8.5)
eGFR: 97 mL/min/{1.73_m2} (ref >59)

## 2022-06-02 LAB — SPECIMEN STATUS REPORT

## 2022-06-02 MED ORDER — VITAMIN D (ERGOCALCIFEROL) 1.25 MG (50000 UNIT) PO CAPS
50000.0000 [IU] | ORAL_CAPSULE | ORAL | 5 refills | Status: DC
Start: 2022-06-02 — End: 2023-03-15

## 2022-06-04 LAB — SPECIMEN STATUS REPORT

## 2022-06-04 LAB — RHEUMATOID FACTOR
Rheumatoid fact SerPl-aCnc: <10 IU/mL (ref <14.0)
Rheumatoid fact SerPl-aCnc: <10 IU/mL (ref <14.0)

## 2022-08-26 ENCOUNTER — Ambulatory Visit (INDEPENDENT_AMBULATORY_CARE_PROVIDER_SITE_OTHER): Payer: BC Managed Care – PPO

## 2022-08-26 DIAGNOSIS — Z23 Encounter for immunization: Secondary | ICD-10-CM

## 2022-08-26 NOTE — Progress Notes (Signed)
      Patient: Jasmine Spencer  DOB: 09/19/1965  MRN: [id number]    Visit Date: 08/26/2022    Jasmine Spencer presents today for her second Shingrix vaccine.  She denies any problems after the first vaccine.  After obtaining informed consent, the immunization is given by Elmyra Ricks.  Patient tolerated the injection well and has no questions.    Immunization History  Administered Date(s) Administered   Influenza Inj Mdck Quad Pf 03/17/2021   Influenza,inj,Quad PF,6+ Mos 12/18/2016, 11/16/2018, 12/19/2019   Zoster Recombinant(Shingrix) 05/27/2022, 08/26/2022     Jacklynn Bue, LPN  40/98/11 9:14 AM

## 2022-08-26 NOTE — Addendum Note (Signed)
Addended by: Jacklynn Bue on: 08/26/2022 08:08 AM   Modules accepted: Level of Service

## 2022-10-08 ENCOUNTER — Other Ambulatory Visit: Payer: Self-pay | Admitting: Physician Assistant

## 2022-10-08 DIAGNOSIS — M25541 Pain in joints of right hand: Secondary | ICD-10-CM

## 2022-10-13 ENCOUNTER — Other Ambulatory Visit: Payer: Self-pay | Admitting: Nurse Practitioner

## 2022-10-13 DIAGNOSIS — R928 Other abnormal and inconclusive findings on diagnostic imaging of breast: Secondary | ICD-10-CM

## 2022-11-10 ENCOUNTER — Ambulatory Visit
Admission: RE | Admit: 2022-11-10 | Discharge: 2022-11-10 | Disposition: A | Discharge status: Home / Self Care | Payer: BC Managed Care – PPO | Source: Ambulatory Visit | Attending: Nurse Practitioner

## 2022-11-10 DIAGNOSIS — R928 Other abnormal and inconclusive findings on diagnostic imaging of breast: Secondary | ICD-10-CM

## 2022-11-12 ENCOUNTER — Other Ambulatory Visit: Payer: Self-pay | Admitting: Nurse Practitioner

## 2022-11-12 DIAGNOSIS — N63 Unspecified lump in unspecified breast: Secondary | ICD-10-CM

## 2022-12-31 ENCOUNTER — Ambulatory Visit: Payer: BC Managed Care – PPO | Admitting: Nurse Practitioner

## 2022-12-31 ENCOUNTER — Encounter: Payer: Self-pay | Admitting: Nurse Practitioner

## 2022-12-31 VITALS — BP 108/64 | HR 64 | Ht 63.0 in | Wt 186.0 lb

## 2022-12-31 DIAGNOSIS — N951 Menopausal and female climacteric states: Secondary | ICD-10-CM | POA: Diagnosis not present

## 2022-12-31 DIAGNOSIS — B009 Herpesviral infection, unspecified: Secondary | ICD-10-CM

## 2022-12-31 DIAGNOSIS — Z01419 Encounter for gynecological examination (general) (routine) without abnormal findings: Secondary | ICD-10-CM

## 2022-12-31 MED ORDER — VALACYCLOVIR HCL 1 G PO TABS: 1000.0000 mg | ORAL_TABLET | Freq: Two times a day (BID) | ORAL | 2 refills | Status: DC

## 2022-12-31 NOTE — Progress Notes (Signed)
Jasmine Spencer 04/06/1965 [id number]   History:  57 y.o. G2P2002 presents for annual exam without GYN complaints. 2016 endometrial ablation. LMP in spring or early summer. Hot flashes were bad over the summer but tolerable now. Normal pap history. HSV-1 with occasional outbreaks.  Gynecologic History No LMP recorded (lmp unknown). Patient is perimenopausal.   Contraception/Family planning: none Sexually active: Yes  Health Maintenance Last Pap: 12/15/2021. Results were: Normal neg HPV Last mammogram: 11/10/2022 (diagnostic). Results were: Stable benign right breast mass Last colonoscopy: 06/16/2021. Results were: Tubular adenomas, 7-year recall Last Dexa: Not indicated  Past medical history, past surgical history, family history and social history were all reviewed and documented in the EPIC chart. 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 Daughter senior in HS.   ROS:  A ROS was performed and pertinent positives and negatives are included.  Exam:  Vitals:   12/31/22 1553  BP: 108/64  Pulse: 64  SpO2: 98%  Weight: 186 lb (84.4 kg)  Height: 5\' 3"  (1.6 m)     Body mass index is 32.95 kg/m.  General appearance:  Normal Thyroid:  Symmetrical, normal in size, without palpable masses or nodularity. Respiratory  Auscultation:  Clear without wheezing or rhonchi Cardiovascular  Auscultation:  Regular rate, without rubs, murmurs or gallops  Edema/varicosities:  Not grossly evident Abdominal  Soft,nontender, without masses, guarding or rebound.  Liver/spleen:  No organomegaly noted  Hernia:  None appreciated  Skin  Inspection:  Grossly normal Breasts: Examined lying and sitting.   Right: Without masses, retractions, nipple discharge or axillary adenopathy.   Left: Without masses, retractions, nipple discharge or axillary adenopathy. Pelvic: External genitalia:  no lesions              Urethra:  normal appearing urethra with no masses,  tenderness or lesions              Bartholins and Skenes: normal                 Vagina: normal appearing vagina with normal color and discharge, no lesions              Cervix: no lesions Bimanual Exam:  Uterus:  no masses or tenderness              Adnexa: no mass, fullness, tenderness              Rectovaginal: Deferred              Anus:  normal, no lesions  Patient informed chaperone available to be present for breast and pelvic exam. Patient has requested no chaperone to be present. Patient has been advised what will be completed during breast and pelvic exam.   Assessment/Plan:  57 y.o. G2P2002 for annual exam.   Well female exam with routine gynecological exam - Education provided on SBEs, importance of preventative screenings, current guidelines, high calcium diet, regular exercise, and multivitamin daily.   Perimenopause - LMP in spring or early summer. Hot flashes were bad over the summer but tolerable now.  HSV-1 infection - Plan: valACYclovir (VALTREX) 1000 MG tablet BID x 3-5 days at first sign of outbreak.   Screening for cervical cancer - Normal pap history. Will repeat at 5-year interval per guidelines.   Screening for breast cancer - Being followed for stable benign breast mass. Scheduled in March for 44-month follow up. Normal breast exam today.   Screening for colon cancer -  05/2021 colonoscopy.  Will repeat at GI's recommended interval.   Screening for osteoporosis - Average risk. Will plan DXA at age 24.   Return in about 1 year (around 12/31/2023) for Annual.      Olivia Mackie [facility], 4:14 PM 12/31/2022

## 2023-01-20 ENCOUNTER — Ambulatory Visit: Payer: BC Managed Care – PPO | Admitting: Physician Assistant

## 2023-01-20 ENCOUNTER — Encounter: Payer: Self-pay | Admitting: Physician Assistant

## 2023-01-20 VITALS — BP 102/66 | HR 71 | Temp 97.9°F | Resp 16 | Ht 63.0 in | Wt 186.6 lb

## 2023-01-20 DIAGNOSIS — M940 Chondrocostal junction syndrome [Tietze]: Secondary | ICD-10-CM

## 2023-01-20 DIAGNOSIS — M62838 Other muscle spasm: Secondary | ICD-10-CM

## 2023-01-20 MED ORDER — IBUPROFEN 800 MG PO TABS
800.0000 mg | ORAL_TABLET | Freq: Three times a day (TID) | ORAL | 0 refills | Status: AC | PRN
Start: 2023-01-20 — End: ?

## 2023-01-20 MED ORDER — TIZANIDINE HCL 4 MG PO TABS: 4.0000 mg | ORAL_TABLET | Freq: Three times a day (TID) | ORAL | 0 refills | Status: DC | PRN

## 2023-01-20 MED ORDER — PREDNISONE 20 MG PO TABS: ORAL_TABLET | ORAL | 0 refills | Status: DC

## 2023-01-20 NOTE — Progress Notes (Signed)
Acute Office Visit  Subjective:    Patient ID: Jasmine Spencer, female    DOB: 1965/03/04, 57 y.o.   MRN: [id number]  Chief Complaint  Patient presents with   Back Pain    HPI: Patient is in today for complaints of upper back pain - she states it started on Sunday after she took a deep breath - she cannot recall history of injury or trauma.  She did move a dryer 2 days before and lifted a good size box the same day She states it hurts worse with movement of upper back/arms - taking deep breaths Has been constant discomfort all week - did take muscle relaxant which did ease symptoms and she was able to sleep Denies dyspnea or chest pains She feels tightness with movement across chest when turning/bending   Current Outpatient Medications:    Docusate Sodium (COLACE PO), Take by mouth., Disp: , Rfl:    ibuprofen (ADVIL) 800 MG tablet, Take 1 tablet (800 mg total) by mouth every 8 (eight) hours as needed., Disp: 30 tablet, Rfl: 0   Multiple Vitamin (MULTIVITAMIN) tablet, Take 1 tablet by mouth daily., Disp: , Rfl:    predniSONE (DELTASONE) 20 MG tablet, 1 po tid for 3 days then 1 po bid for 3 days then 1 po qd for 3 days, Disp: 18 tablet, Rfl: 0   tiZANidine (ZANAFLEX) 4 MG tablet, Take 1 tablet (4 mg total) by mouth every 8 (eight) hours as needed for muscle spasms., Disp: 30 tablet, Rfl: 0   valACYclovir (VALTREX) 1000 MG tablet, Take 1 tablet (1,000 mg total) by mouth 2 (two) times daily. Take twice daily at first sign of outbreak x 3-5 days, Disp: 30 tablet, Rfl: 2   Vitamin D, Ergocalciferol, (DRISDOL) 1.25 MG (50000 UNIT) CAPS capsule, Take 1 capsule (50,000 Units total) by mouth every 7 (seven) days., Disp: 5 capsule, Rfl: 5  Allergies  Allergen Reactions   Milk-Related Compounds Other (See Comments)    Lactose intolerance.   Penicillins Other (See Comments)    Childhood allergy.   Latex Rash    ROS CONSTITUTIONAL: Negative for chills, fatigue, fever,  E/N/T: Negative for  ear pain, nasal congestion and sore throat.  CARDIOVASCULAR: Negative for chest pain, dizziness, palpitations and pedal edema.  RESPIRATORY: Negative for recent cough and dyspnea.  GASTROINTESTINAL: Negative for abdominal pain, acid reflux symptoms, constipation, diarrhea, nausea and vomiting.  MSK:see HPI INTEGUMENTARY: Negative for rash.  NEUROLOGICAL: Negative for dizziness and headaches.       Objective:    PHYSICAL EXAM:   BP 102/66 (BP Location: Right Arm, Patient Position: Sitting, Cuff Size: Large)   Pulse 71   Temp 97.9 F (36.6 C)   Resp 16   Ht 5\' 3"  (1.6 m)   Wt 186 lb 9.6 oz (84.6 kg)   LMP  (LMP Unknown)   SpO2 98%   BMI 33.05 kg/m    GEN: Well nourished, well developed, in no acute distress  Cardiac: RRR; no murmurs, rubs, or gallops,no edema - Respiratory:  normal respiratory rate and pattern with no distress - normal breath sounds with no rales, rhonchi, wheezes or rubs GI: normal bowel sounds, no masses or tenderness MS: muscle spasm noted to left upper back - palpable tenderness around left shoulder blade and into left side of neck and left shoulder      Assessment & Plan:    Costochondritis, acute -     Ibuprofen; Take 1 tablet (800 mg total) by  mouth every 8 (eight) hours as needed.  Dispense: 30 tablet; Refill: 0 -     predniSONE; 1 po tid for 3 days then 1 po bid for 3 days then 1 po qd for 3 days  Dispense: 18 tablet; Refill: 0 -     tiZANidine HCl; Take 1 tablet (4 mg total) by mouth every 8 (eight) hours as needed for muscle spasms.  Dispense: 30 tablet; Refill: 0  Muscle spasm -     tiZANidine HCl; Take 1 tablet (4 mg total) by mouth every 8 (eight) hours as needed for muscle spasms.  Dispense: 30 tablet; Refill: 0     Follow-up: Return if symptoms worsen or fail to improve.  An After Visit Summary was printed and given to the patient.  Jettie Pagan [facility] [phone]

## 2023-02-09 ENCOUNTER — Ambulatory Visit: Payer: BC Managed Care – PPO | Admitting: Physician Assistant

## 2023-02-09 ENCOUNTER — Encounter: Payer: Self-pay | Admitting: Physician Assistant

## 2023-02-09 VITALS — BP 100/64 | HR 77 | Temp 97.4°F | Ht 63.0 in | Wt 189.0 lb

## 2023-02-09 DIAGNOSIS — M940 Chondrocostal junction syndrome [Tietze]: Secondary | ICD-10-CM | POA: Insufficient documentation

## 2023-02-09 DIAGNOSIS — N3001 Acute cystitis with hematuria: Secondary | ICD-10-CM | POA: Diagnosis not present

## 2023-02-09 LAB — POCT URINALYSIS DIP (CLINITEK)
Bilirubin, UA: NEGATIVE
Glucose, UA: NEGATIVE mg/dL
Ketones, POC UA: NEGATIVE mg/dL
Leukocytes, UA: NEGATIVE
Nitrite, UA: NEGATIVE
POC PROTEIN,UA: NEGATIVE
Spec Grav, UA: <=1.005 — AB (ref 1.010–1.025)
Urobilinogen, UA: 0.2 U/dL
pH, UA: 6 (ref 5.0–8.0)

## 2023-02-09 MED ORDER — CIPROFLOXACIN HCL 500 MG PO TABS: 500.0000 mg | ORAL_TABLET | Freq: Two times a day (BID) | ORAL | 0 refills | Status: AC

## 2023-02-09 NOTE — Progress Notes (Signed)
Subjective:  Patient ID: Jasmine Spencer, female    DOB: [birth year]  Age: 57 y.o. MRN: [id number]  Chief Complaint  Patient presents with   Back Pain   Dysuria    HPI Pt complains of dysuria and urgency for the past week - has had some mild lower abdominal discomfort also Denies fever, nausea , vomiting - no vaginal symptoms - no bowel problems  Pt states overall she is feeling better after being treated for costochondritis with ibuprofen and prednisone - she still has some tenderness under left shoulder blade with extension of left arm and when sitting back in chair can feel tenderness.  Denies chest tightness and no further pain with breathing     05/27/2022    3:24 PM 03/17/2021    2:06 PM 03/23/2015   12:20 PM  Depression screen PHQ 2/9  Decreased Interest 0 0 0  Down, Depressed, Hopeless 0 0 0  PHQ - 2 Score 0 0 0        01/23/2020    8:20 AM 05/27/2022    3:24 PM 12/31/2022    3:58 PM  Fall Risk  Falls in the past year?  0   Was there an injury with Fall?  0 0  Fall Risk Category Calculator  0   (RETIRED) Patient Fall Risk Level Low fall risk    Patient at Risk for Falls Due to  No Fall Risks No Fall Risks  Fall risk Follow up  Falls evaluation completed Falls evaluation completed     ROS CONSTITUTIONAL: Negative for chills, fatigue, fever,  E/N/T: Negative for ear pain, nasal congestion and sore throat.  CARDIOVASCULAR: Negative for chest pain, dizziness, palpitations and pedal edema.  RESPIRATORY: Negative for recent cough and dyspnea.  GASTROINTESTINAL: Negative for abdominal pain, acid reflux symptoms, constipation, diarrhea, nausea and vomiting.  GU- see HPI MSK: see HPI INTEGUMENTARY: Negative for rash.     Current Outpatient Medications:    ciprofloxacin (CIPRO) 500 MG tablet, Take 1 tablet (500 mg total) by mouth 2 (two) times daily for 10 days., Disp: 20 tablet, Rfl: 0   clobetasol ointment (TEMOVATE) 0.05 %, SMARTSIG:sparingly Topical Twice Daily,  Disp: , Rfl:    dexamethasone 0.5 MG/5ML elixir, SWISH WITH 5 MLS FOR 2 MINUTES FOUR TIMES PER DAY THEN SPIT OUT, Disp: , Rfl:    Docusate Sodium (COLACE PO), Take by mouth., Disp: , Rfl:    fluocinonide gel (LIDEX) 0.05 %, Apply topically., Disp: , Rfl:    ibuprofen (ADVIL) 800 MG tablet, Take 1 tablet (800 mg total) by mouth every 8 (eight) hours as needed., Disp: 30 tablet, Rfl: 0   Multiple Vitamin (MULTIVITAMIN) tablet, Take 1 tablet by mouth daily., Disp: , Rfl:    valACYclovir (VALTREX) 1000 MG tablet, Take 1 tablet (1,000 mg total) by mouth 2 (two) times daily. Take twice daily at first sign of outbreak x 3-5 days, Disp: 30 tablet, Rfl: 2   Vitamin D, Ergocalciferol, (DRISDOL) 1.25 MG (50000 UNIT) CAPS capsule, Take 1 capsule (50,000 Units total) by mouth every 7 (seven) days., Disp: 5 capsule, Rfl: 5  Past Medical History:  Diagnosis Date   Arthritis    R knee   Complication of anesthesia    Pt. has panicky feeling  ( feels like she can't breathe)upon awaking fr. anesth. , NEEDS TO BE SITTING UPRIGHT   Elevated cholesterol    Family history of anesthesia complication    pt.'s father is very agitated /w MORPHINE, can  tolerate DILAUDID   Gallstones    H/O hiatal hernia    h/o - several yrs. ago, took Prilosec for 1 yr., no longer a problem    Headache(784.0)    migraine- hormone related    Objective:  PHYSICAL EXAM:   BP 100/64   Pulse 77   Temp (!) 97.4 F (36.3 C)   Ht 5\' 3"  (1.6 m)   Wt 189 lb (85.7 kg)   LMP  (LMP Unknown)   SpO2 97%   BMI 33.48 kg/m    GEN: Well nourished, well developed, in no acute distress  Cardiac: RRR; no murmurs,  Respiratory:  normal respiratory rate and pattern with no distress - normal breath sounds with no rales, rhonchi, wheezes or rubs GI: normal bowel sounds, no masses or tenderness MS: tender to palpation at left shoulder blade Skin: warm and dry, no rash   Office Visit on 02/09/2023  Component Date Value Ref Range Status    Color, UA 02/09/2023 yellow  yellow Final   Clarity, UA 02/09/2023 clear  clear Final   Glucose, UA 02/09/2023 negative  negative mg/dL Final   Bilirubin, UA 16/11/9602 negative  negative Final   Ketones, POC UA 02/09/2023 negative  negative mg/dL Final   Spec Grav, UA 54/10/8117 <=1.005 (A)  1.010 - 1.025 Final   Blood, UA 02/09/2023 small (A)  negative Final   pH, UA 02/09/2023 6.0  5.0 - 8.0 Final   POC PROTEIN,UA 02/09/2023 negative  negative, trace Final   Urobilinogen, UA 02/09/2023 0.2  0.2 or 1.0 E.U./dL Final   Nitrite, UA 14/78/2956 Negative  Negative Final   Leukocytes, UA 02/09/2023 Negative  Negative Final    Assessment & Plan:    Acute hemorrhagic cystitis -     POCT URINALYSIS DIP (CLINITEK) -     Urine Culture -     Ciprofloxacin HCl; Take 1 tablet (500 mg total) by mouth 2 (two) times daily for 10 days.  Dispense: 20 tablet; Refill: 0  Costochondritis, acute Continue ibuprofen as needed    Follow-up: Return in about 3 weeks (around 03/02/2023) for ua - nurse visit.  An After Visit Summary was printed and given to the patient.  Jettie Pagan [facility] [phone]

## 2023-02-10 LAB — URINE CULTURE

## 2023-03-02 ENCOUNTER — Telehealth: Payer: Self-pay | Admitting: Physician Assistant

## 2023-03-02 ENCOUNTER — Ambulatory Visit: Payer: 59

## 2023-03-02 DIAGNOSIS — N3001 Acute cystitis with hematuria: Secondary | ICD-10-CM | POA: Diagnosis not present

## 2023-03-02 LAB — POCT URINALYSIS DIP (CLINITEK)
Bilirubin, UA: NEGATIVE
Glucose, UA: NEGATIVE mg/dL
Ketones, POC UA: NEGATIVE mg/dL
Leukocytes, UA: NEGATIVE
Nitrite, UA: NEGATIVE
POC PROTEIN,UA: NEGATIVE
Spec Grav, UA: 1.015 (ref 1.010–1.025)
Urobilinogen, UA: 0.2 U/dL
pH, UA: 5.5 (ref 5.0–8.0)

## 2023-03-02 NOTE — Progress Notes (Signed)
 Patient was notified that urine analysis has blood 1+. She denies UTI symptoms.  Camie Moats, PA-C recommends to check UA in the end of the months, if hematuria persist, she will refer her to urologist for further evaluation.  Patient is agree. She will make an appointment.

## 2023-03-02 NOTE — Telephone Encounter (Unsigned)
 Copied from CRM [id number]. Topic: Appointments - Appointment Scheduling >> Mar 02, 2023  8:26 AM Jorje Guild R wrote: Patient/patient representative is calling to schedule an appointment. Refer to attachments for appointment information.

## 2023-03-15 ENCOUNTER — Other Ambulatory Visit: Payer: Self-pay | Admitting: Physician Assistant

## 2023-03-15 DIAGNOSIS — E559 Vitamin D deficiency, unspecified: Secondary | ICD-10-CM

## 2023-03-29 ENCOUNTER — Other Ambulatory Visit: Payer: Self-pay | Admitting: Physician Assistant

## 2023-03-29 ENCOUNTER — Telehealth: Payer: Self-pay

## 2023-03-29 ENCOUNTER — Ambulatory Visit (INDEPENDENT_AMBULATORY_CARE_PROVIDER_SITE_OTHER): Payer: 59

## 2023-03-29 DIAGNOSIS — N029 Recurrent and persistent hematuria with unspecified morphologic changes: Secondary | ICD-10-CM

## 2023-03-29 DIAGNOSIS — R319 Hematuria, unspecified: Secondary | ICD-10-CM

## 2023-03-29 LAB — POCT URINALYSIS DIP (CLINITEK)
Bilirubin, UA: NEGATIVE
Glucose, UA: NEGATIVE mg/dL
Ketones, POC UA: NEGATIVE mg/dL
Leukocytes, UA: NEGATIVE
Nitrite, UA: NEGATIVE
POC PROTEIN,UA: NEGATIVE
Spec Grav, UA: 1.02 (ref 1.010–1.025)
Urobilinogen, UA: 0.2 U/dL
pH, UA: 5.5 (ref 5.0–8.0)

## 2023-03-29 NOTE — Telephone Encounter (Signed)
Called patient to make her aware of UA results,  Left message for patient to call office back

## 2023-03-29 NOTE — Progress Notes (Signed)
Patient is in office today for a nurse visit for Repeat UA. Patient UA was  positive for RBCs

## 2023-05-11 ENCOUNTER — Ambulatory Visit
Admission: RE | Admit: 2023-05-11 | Discharge: 2023-05-11 | Disposition: A | Discharge status: Home / Self Care | Payer: BC Managed Care – PPO | Source: Ambulatory Visit | Attending: Nurse Practitioner | Admitting: Nurse Practitioner

## 2023-05-11 DIAGNOSIS — N63 Unspecified lump in unspecified breast: Secondary | ICD-10-CM

## 2023-07-09 ENCOUNTER — Other Ambulatory Visit: Payer: Self-pay | Admitting: Family Medicine

## 2023-07-09 DIAGNOSIS — E559 Vitamin D deficiency, unspecified: Secondary | ICD-10-CM

## 2023-11-16 ENCOUNTER — Ambulatory Visit: Admitting: Physician Assistant

## 2023-11-16 ENCOUNTER — Encounter: Payer: Self-pay | Admitting: Physician Assistant

## 2023-11-16 VITALS — BP 112/68 | HR 88 | Temp 98.3°F | Resp 18 | Ht 63.0 in | Wt 189.4 lb

## 2023-11-16 DIAGNOSIS — R209 Unspecified disturbances of skin sensation: Secondary | ICD-10-CM

## 2023-11-16 DIAGNOSIS — Z23 Encounter for immunization: Secondary | ICD-10-CM | POA: Diagnosis not present

## 2023-11-16 DIAGNOSIS — E559 Vitamin D deficiency, unspecified: Secondary | ICD-10-CM

## 2023-11-16 NOTE — Progress Notes (Signed)
 Acute Office Visit  Subjective:    Patient ID: Jasmine Spencer, female    DOB: October 28, 1965, 58 y.o.   MRN: [id number]  Chief Complaint  Patient presents with   Cold Extremity    HPI: Patient is in today for complaints that since March she gets sensations of coldness in her right upper arm.  She has now noted she also gets same sensation in left upper arm as well Sensation comes and goes - can last few minutes - several minutes She denies weakness, numbness, rash in either upper extremity Denies headaches, neck pain Pt does have vit D def and takes weekly supplement  Pt would like flu shot today   Current Outpatient Medications:    clobetasol ointment (TEMOVATE) 0.05 %, SMARTSIG:sparingly Topical Twice Daily, Disp: , Rfl:    dexamethasone  0.5 MG/5ML elixir, SWISH WITH 5 MLS FOR 2 MINUTES FOUR TIMES PER DAY THEN SPIT OUT, Disp: , Rfl:    Docusate Sodium  (COLACE PO), Take by mouth., Disp: , Rfl:    fluocinonide gel (LIDEX) 0.05 %, Apply topically., Disp: , Rfl:    ibuprofen  (ADVIL ) 800 MG tablet, Take 1 tablet (800 mg total) by mouth every 8 (eight) hours as needed., Disp: 30 tablet, Rfl: 0   Multiple Vitamin (MULTIVITAMIN) tablet, Take 1 tablet by mouth daily., Disp: , Rfl:    valACYclovir  (VALTREX ) 1000 MG tablet, Take 1 tablet (1,000 mg total) by mouth 2 (two) times daily. Take twice daily at first sign of outbreak x 3-5 days, Disp: 30 tablet, Rfl: 2   Vitamin D , Ergocalciferol , (DRISDOL ) 1.25 MG (50000 UNIT) CAPS capsule, TAKE 1 CAPSULE BY MOUTH EVERY 7 DAYS., Disp: 5 capsule, Rfl: 2  Allergies  Allergen Reactions   Milk-Related Compounds Other (See Comments)    Lactose intolerance.   Penicillins Other (See Comments)    Childhood allergy.   Latex Rash    ROS CONSTITUTIONAL: Negative for chills, fatigue, fever, unintentional weight gain and unintentional weight loss.  CARDIOVASCULAR: Negative for chest pain, dizziness, palpitations and pedal edema.  RESPIRATORY: Negative  for recent cough and dyspnea.  GASTROINTESTINAL: Negative for abdominal pain, acid reflux symptoms, constipation, diarrhea, nausea and vomiting.  MSK: Negative for arthralgias and myalgias. - see HPI INTEGUMENTARY: Negative for rash.       Objective:    PHYSICAL EXAM:   BP 112/68   Pulse 88   Temp 98.3 F (36.8 C) (Temporal)   Resp 18   Ht 5' 3 (1.6 m)   Wt 189 lb 6.4 oz (85.9 kg)   LMP  (LMP Unknown)   SpO2 98%   BMI 33.55 kg/m    GEN: Well nourished, well developed, in no acute distress   Neck: no JVD or masses - no thyromegaly Cardiac: RRR; no murmurs, rubs, or gallops,no edema -  Respiratory:  normal respiratory rate and pattern with no distress - normal breath sounds with no rales, rhonchi, wheezes or rubs GI: normal bowel sounds, no masses or tenderness MS: no deformity or atrophy - normal rom /strength upper extremities Skin: warm and dry, no rash      Assessment & Plan:    Cold extremity without peripheral vascular disease -     Fe+CBC/D/Plt+TIBC+Fer+Retic -     Comprehensive metabolic panel with GFR -     TSH -     VITAMIN D  25 Hydroxy (Vit-D Deficiency, Fractures) -     B12 and Folate Panel  Vitamin D  insufficiency -     VITAMIN D   25 Hydroxy (Vit-D Deficiency, Fractures)  Immunization due -     Flu vaccine, recombinant, trivalent, inj     Follow-up: No follow-ups on file.  An After Visit Summary was printed and given to the patient.  CAMIE JONELLE NICHOLAUS DEVONNA [facility] [phone]

## 2023-11-17 ENCOUNTER — Ambulatory Visit: Payer: Self-pay | Admitting: Physician Assistant

## 2023-11-17 LAB — TSH: TSH: 0.693 u[IU]/mL (ref 0.450–4.500)

## 2023-11-17 LAB — FE+CBC/D/PLT+TIBC+FER+RETIC
Basophils Absolute: 0 x10E3/uL (ref 0.0–0.2)
Basos: 1 %
EOS (ABSOLUTE): 0.2 x10E3/uL (ref 0.0–0.4)
Eos: 5 %
Ferritin: 150 ng/mL (ref 15–150)
Hematocrit: 40.3 % (ref 34.0–46.6)
Hemoglobin: 13.3 g/dL (ref 11.1–15.9)
Immature Grans (Abs): 0 x10E3/uL (ref 0.0–0.1)
Immature Granulocytes: 0 %
Iron Saturation: 21 % (ref 15–55)
Iron: 63 ug/dL (ref 27–159)
Lymphocytes Absolute: 2 x10E3/uL (ref 0.7–3.1)
Lymphs: 37 %
MCH: 29.7 pg (ref 26.6–33.0)
MCHC: 33 g/dL (ref 31.5–35.7)
MCV: 90 fL (ref 79–97)
Monocytes Absolute: 0.4 x10E3/uL (ref 0.1–0.9)
Monocytes: 8 %
Neutrophils Absolute: 2.7 x10E3/uL (ref 1.4–7.0)
Neutrophils: 49 %
Platelets: 261 x10E3/uL (ref 150–450)
RBC: 4.48 x10E6/uL (ref 3.77–5.28)
RDW: 12.1 % (ref 11.7–15.4)
Retic Ct Pct: 2.4 % (ref 0.6–2.6)
Total Iron Binding Capacity: 296 ug/dL (ref 250–450)
UIBC: 233 ug/dL (ref 131–425)
WBC: 5.4 x10E3/uL (ref 3.4–10.8)

## 2023-11-17 LAB — COMPREHENSIVE METABOLIC PANEL WITH GFR
ALT: 10 IU/L (ref 0–32)
AST: 12 IU/L (ref 0–40)
Albumin: 4.5 g/dL (ref 3.8–4.9)
Alkaline Phosphatase: 67 IU/L (ref 49–135)
BUN/Creatinine Ratio: 20 (ref 9–23)
BUN: 17 mg/dL (ref 6–24)
Bilirubin Total: 0.3 mg/dL (ref 0.0–1.2)
CO2: 21 mmol/L (ref 20–29)
Calcium: 9.3 mg/dL (ref 8.7–10.2)
Chloride: 103 mmol/L (ref 96–106)
Creatinine, Ser: 0.83 mg/dL (ref 0.57–1.00)
Globulin, Total: 2.3 g/dL (ref 1.5–4.5)
Glucose: 128 mg/dL — ABNORMAL HIGH (ref 70–99)
Potassium: 4 mmol/L (ref 3.5–5.2)
Sodium: 140 mmol/L (ref 134–144)
Total Protein: 6.8 g/dL (ref 6.0–8.5)
eGFR: 82 mL/min/1.73

## 2023-11-17 LAB — B12 AND FOLATE PANEL
Folate: 11.4 ng/mL (ref >3.0)
Vitamin B-12: 246 pg/mL (ref 232–1245)

## 2023-11-17 LAB — VITAMIN D 25 HYDROXY (VIT D DEFICIENCY, FRACTURES): Vit D, 25-Hydroxy: 30.7 ng/mL (ref 30.0–100.0)

## 2023-12-10 ENCOUNTER — Other Ambulatory Visit: Payer: Self-pay | Admitting: Physician Assistant

## 2023-12-10 DIAGNOSIS — E559 Vitamin D deficiency, unspecified: Secondary | ICD-10-CM

## 2024-02-07 ENCOUNTER — Other Ambulatory Visit: Payer: Self-pay | Admitting: Nurse Practitioner

## 2024-02-07 DIAGNOSIS — B009 Herpesviral infection, unspecified: Secondary | ICD-10-CM

## 2024-02-07 NOTE — Telephone Encounter (Signed)
 Med refill request:valacyclovir  1000 mg tab PO daily at first sign of outbreak x 3-5 days  Hx HSV 1  Last AEX: 12/31/2022 -TW Next AEX: 03/03/24 -TW Last MMG (if hormonal med) N/A   Last Rx sent 12/31/22, #30/2RF  Rx authorized per [facility] Refill protocol #30/0RF

## 2024-03-02 NOTE — Progress Notes (Unsigned)
 "  Jasmine Spencer [birth year] [id number]   History:  59 y.o. G2P2002 presents for annual exam. 2016 endometrial ablation. Postmenopausal? Normal pap history. HSV-1 with occasional outbreaks.  Gynecologic History No LMP recorded. Patient is perimenopausal.   Contraception/Family planning: none Sexually active: Yes  Health Maintenance Last Pap: 12/15/2021. Results were: Normal neg HPV Last mammogram: 05/11/2023 (diagnostic). Results were: Stable benign right breast mass, small decrease in size Last colonoscopy: 06/16/2021. Results were: Tubular adenomas, 7-year recall Last Dexa: Not indicated     11/16/2023    2:17 PM  Depression screen PHQ 2/9  Decreased Interest 0  Down, Depressed, Hopeless 0  PHQ - 2 Score 0     Past medical history, past surgical history, family history and social history were all reviewed and documented in the EPIC chart. Married. HS retail buyer. Son drafted to [employer], in [place] right now. Daughter senior in HS.   ROS:  A ROS was performed and pertinent positives and negatives are included.  Exam:  There were no vitals filed for this visit.    There is no height or weight on file to calculate BMI.  General appearance:  Normal Thyroid:  Symmetrical, normal in size, without palpable masses or nodularity. Respiratory  Auscultation:  Clear without wheezing or rhonchi Cardiovascular  Auscultation:  Regular rate, without rubs, murmurs or gallops  Edema/varicosities:  Not grossly evident Abdominal  Soft,nontender, without masses, guarding or rebound.  Liver/spleen:  No organomegaly noted  Hernia:  None appreciated  Skin  Inspection:  Grossly normal Breasts: Examined lying and sitting.   Right: Without masses, retractions, nipple discharge or axillary adenopathy.   Left: Without masses, retractions, nipple discharge or axillary adenopathy. Pelvic: External genitalia:  no lesions              Urethra:  normal appearing urethra with no masses,  tenderness or lesions              Bartholins and Skenes: normal                 Vagina: normal appearing vagina with normal color and discharge, no lesions              Cervix: no lesions Bimanual Exam:  Uterus:  no masses or tenderness              Adnexa: no mass, fullness, tenderness              Rectovaginal: Deferred              Anus:  normal, no lesions  Patient informed chaperone available to be present for breast and pelvic exam. Patient has requested no chaperone to be present. Patient has been advised what will be completed during breast and pelvic exam.   Assessment/Plan:  59 y.o. G2P2002 for annual exam.   Well female exam with routine gynecological exam - Education provided on SBEs, importance of preventative screenings, current guidelines, high calcium diet, regular exercise, and multivitamin daily.   Perimenopause - LMP in spring or early summer. Hot flashes were bad over the summer but tolerable now.  HSV-1 infection - Plan: valACYclovir  (VALTREX ) 1000 MG tablet BID x 3-5 days at first sign of outbreak.   Screening for cervical cancer - Normal pap history. Will repeat at 5-year interval per guidelines.   Screening for breast cancer - Being followed for stable benign breast mass. Normal breast exam today.   Screening for colon cancer -  05/2021 colonoscopy. Will repeat at  GI's recommended interval.   Screening for osteoporosis - Average risk. Will plan DXA at age 48.   No follow-ups on file.      Annabella DELENA [facility], 1:35 PM 03/02/2024 "

## 2024-03-03 ENCOUNTER — Ambulatory Visit: Admitting: Nurse Practitioner

## 2024-03-03 DIAGNOSIS — B009 Herpesviral infection, unspecified: Secondary | ICD-10-CM

## 2024-03-03 DIAGNOSIS — Z1331 Encounter for screening for depression: Secondary | ICD-10-CM

## 2024-03-03 DIAGNOSIS — Z01419 Encounter for gynecological examination (general) (routine) without abnormal findings: Secondary | ICD-10-CM

## 2024-03-03 DIAGNOSIS — Z780 Asymptomatic menopausal state: Secondary | ICD-10-CM

## 2024-04-04 ENCOUNTER — Ambulatory Visit: Admitting: Nurse Practitioner
